# Patient Record
Sex: Male | Born: 1940 | Race: White | Hispanic: No | Smoking: Former smoker
Health system: Southern US, Community
[De-identification: ages and names within clinical notes are randomized; demographics above are authoritative.]

## PROBLEM LIST (undated history)

## (undated) DIAGNOSIS — R55 Syncope and collapse: Secondary | ICD-10-CM

## (undated) DIAGNOSIS — F329 Major depressive disorder, single episode, unspecified: Secondary | ICD-10-CM

## (undated) DIAGNOSIS — G4730 Sleep apnea, unspecified: Secondary | ICD-10-CM

## (undated) DIAGNOSIS — F32A Depression, unspecified: Secondary | ICD-10-CM

## (undated) DIAGNOSIS — E785 Hyperlipidemia, unspecified: Secondary | ICD-10-CM

## (undated) DIAGNOSIS — M1712 Unilateral primary osteoarthritis, left knee: Secondary | ICD-10-CM

## (undated) DIAGNOSIS — M1711 Unilateral primary osteoarthritis, right knee: Secondary | ICD-10-CM

## (undated) DIAGNOSIS — IMO0001 Reserved for inherently not codable concepts without codable children: Secondary | ICD-10-CM

## (undated) DIAGNOSIS — I10 Essential (primary) hypertension: Secondary | ICD-10-CM

## (undated) HISTORY — PX: KNEE SURGERY: SHX244

## (undated) HISTORY — PX: ROTATOR CUFF REPAIR: SHX139

## (undated) HISTORY — PX: JOINT REPLACEMENT: SHX530

## (undated) HISTORY — PX: EYE SURGERY: SHX253

---
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
## 2004-10-28 ENCOUNTER — Ambulatory Visit: Payer: Self-pay | Admitting: Internal Medicine

## 2004-10-28 ENCOUNTER — Ambulatory Visit (HOSPITAL_BASED_OUTPATIENT_CLINIC_OR_DEPARTMENT_OTHER): Admission: RE | Admit: 2004-10-28 | Discharge: 2004-10-28 | Payer: Self-pay | Admitting: Family Medicine

## 2004-11-11 ENCOUNTER — Ambulatory Visit (HOSPITAL_BASED_OUTPATIENT_CLINIC_OR_DEPARTMENT_OTHER): Admission: RE | Admit: 2004-11-11 | Discharge: 2004-11-11 | Payer: Self-pay | Admitting: Family Medicine

## 2007-08-30 ENCOUNTER — Ambulatory Visit: Payer: Self-pay

## 2008-04-23 ENCOUNTER — Ambulatory Visit (HOSPITAL_BASED_OUTPATIENT_CLINIC_OR_DEPARTMENT_OTHER): Admission: RE | Admit: 2008-04-23 | Discharge: 2008-04-23 | Payer: Self-pay | Admitting: Orthopedic Surgery

## 2008-04-30 ENCOUNTER — Encounter: Admission: RE | Admit: 2008-04-30 | Discharge: 2008-04-30 | Payer: Self-pay | Admitting: Orthopedic Surgery

## 2009-01-02 ENCOUNTER — Encounter: Admission: RE | Admit: 2009-01-02 | Discharge: 2009-01-02 | Payer: Self-pay | Admitting: Podiatry

## 2009-04-02 ENCOUNTER — Encounter: Admission: RE | Admit: 2009-04-02 | Discharge: 2009-04-02 | Payer: Self-pay | Admitting: Family Medicine

## 2011-03-03 NOTE — Op Note (Signed)
NAMEJAIYON, Joe [name]                   ACCOUNT NO.:  [id number]   MEDICAL RECORD NO.:  [id number]          PATIENT TYPE:  AMB   LOCATION:  DSC                          FACILITY:  [facility]   PHYSICIAN:  Robert A. Thurston Hole, M.D. DATE OF BIRTH:  13-Aug-1941   DATE OF PROCEDURE:  04/23/2008  DATE OF DISCHARGE:                               OPERATIVE REPORT   PREOPERATIVE DIAGNOSES:  Left knee medial and lateral meniscal tears  with chondromalacia and synovitis.   POSTOPERATIVE DIAGNOSES:  Left knee medial and lateral meniscal tears  with chondromalacia and synovitis.   PROCEDURE:  1. Left knee EUA, followed by arthroscopic partial, medial, and      lateral meniscectomies.  2. Left knee chondroplasty with partial synovectomy.   SURGEON:  Nilda Simmer, MD   ANESTHESIA:  General.   OPERATIVE TIME:  Operative time of 40 minutes.   COMPLICATIONS:  None.   INDICATIONS FOR PROCEDURE:  Joe [name] is a 70 year old gentleman who has  had significant left knee pain for the past 8-10 months, increasing in  nature with the examined MRI documenting meniscal tear with  chondromalacia and synovitis.  He has failed conservative care and is  now to undergo arthroscopy.   DESCRIPTION:  Joe [name] is brought in the operating room on April 23, 2008,  after a knee block was placed in the holding area by anesthesia.  He was  placed in the operative table in the supine position.  After being  placed under general anesthesia, his left knee was examined.  He had  full range of motion, 1-2+ crepitation in knee, stable ligamentous exam  with normal patellar tracking.  Left leg was prepped using sterile  DuraPrep and draped using a sterile technique.   Originally, through an anterolateral portal, the arthroscope with a pump  attachment was placed into anteromedial portal and arthroscopic probe  was placed.  On initial inspection of the medial compartment, the  articular cartilage had a 50-60% grade III  chondromalacia, which was  debrided.  Medial meniscus tear to the posterior medial horn of which 40-  50% was resected back to stable rim.  The intercondylar notch was  inspected.  Anterior and posterior cruciate ligaments were normal.  Lateral compartment inspected.  Articular cartilage showed 25-30% grade  III chondromalacia, which was debrided.   Lateral meniscus showed a tear of 20% to the posterolateral corner,  which was resected back to a stable rim.  Patellofemoral joint showed  50% grade III chondromalacia on the patellofemoral groove, and this was  debrided.  The patella tracked normally.  Moderate synovitis in the  medial and lateral gutters were debrided, and otherwise they are free of  pathology.  At this time, it was felt  that all the pathology have been  satisfactorily addressed.  The instruments were removed.  Portals were  closed with 3-0 nylon suture and injected with 0.25% Marcaine with  epinephrine and 4 mg of morphine.  Sterile dressing were placed.  The  patient awakened and taken to recovery room in stable condition.   FOLLOWUP CARE:  Joe [name] will be followed as an outpatient and on  Vicodin for pain.  He will be seen back in the office in a week for  sutures out and followup.      Robert A. Thurston Hole, M.D.  Electronically Signed     RAW/MEDQ  D:  04/23/2008  T:  04/24/2008  Job:  [id number]

## 2011-03-06 NOTE — Procedures (Signed)
NAME:  Joe [name], Joe [name]                   ACCOUNT NO.:  [id number]   MEDICAL RECORD NO.:  [id number]          PATIENT TYPE:  OUT   LOCATION:  SLEEP CENTER                 FACILITY:  [facility]   PHYSICIAN:  Clinton D. Maple Hudson, M.D. DATE OF BIRTH:  02/23/1941   DATE OF STUDY:  11/11/2004                              NOCTURNAL POLYSOMNOGRAM   STUDY DATE:  11/11/04   REFERRING PHYSICIAN:  Donia Guiles, M.D.   INDICATION FOR STUDY:  Hypersomnia with sleep apnea.   EPWORTH SLEEPINESS SCORE:  16/24.   BMI:  37.8.   WEIGHT:  250 pounds.   Diagnostic NPSG on 10/28/04 recorded RDI of 113.9 per hour.  CPAP titration  is requested.   SLEEP ARCHITECTURE:  Total sleep time 386 minutes with sleep efficiency 83%.  Stage I was 4%, stage II 73%, stages III and IV 5%.  REM was 18% of total  sleep time.  Sleep latency 50 minutes.  REM latency 224 minutes.  Awake  after sleep onset 31 minutes.  Arousal index 22.   RESPIRATORY DATA:  CPAP titration protocol.  CPAP was titrated to 16 CWP,  RDI of 14 per hour.  At this level, snoring was eliminated.  There were  residual apneas.  Because of persistent snoring, the technician advanced  CPAP above 12 CWP at which level RDI was 0.  There may have been some  decline in effect with increasing pressure, related to increasing nasal  congestion.  A medium comfort gel mask was used with heated humidifier.   OXYGEN DATA:  Very loud snoring before CPAP control until final elimination  at 16 CWP.  Oxygen saturation at final CPAP pressures was held at 95% to 96%  on room air.   CARDIAC DATA:  Normal sinus rhythm.   MOVEMENTS/PARASOMNIA:  Frequent leg jerks with a total of 704 recorded of  which 45 were associated with arousal or awakening for periodic limb  movement with arousal index of 7 per hour which is increased.   IMPRESSION/RECOMMENDATION:  1.  CPAP titration to 16 CWP, RDI 14 per hour using a medium comfort gel      nasal mask with heated humidifier.  This  pressure was required for      snoring control.  Suggest adjustment downward towards 12 CWP if      necessary for comfort.  2.  Periodic limb movement with arousal, 7 per hour.  This may clear with      treatment for sleep apnea.  Otherwise consider trial of Clonazepam or      Requip if clinically appropriate.      CDY/MEDQ  D:  11/16/2004 10:49:14  T:  11/16/2004 16:02:42  Job:  [id number]

## 2011-03-06 NOTE — Procedures (Signed)
NAME:  [name], Joe                   ACCOUNT NO.:  [id number]   MEDICAL RECORD NO.:  [id number]          PATIENT TYPE:  OUT   LOCATION:  SLEEP CENTER                 [facility]ACILITY:  [facility]   PHYSICIAN:  Clinton D. Maple Hudson, M.D. DATE OF BIRTH:  02-02-1941   DATE OF STUDY:  10/28/2004                              NOCTURNAL POLYSOMNOGRAM   INDICATIONS FOR STUDY:  Hypersomnia with sleep apnea. BMI 37.8, weight 250  pounds.   SLEEP ARCHITECTURE:  Total sleep time 247 minutes with sleep efficiency  reduced at 42%. Stage I was 4%, stage II was 47%, stages III and IV were  absent, REM was 7% of total sleep time. Latency to sleep onset 129 minutes.  Latency to REM 229 minutes. Awake after sleep onset 59 minutes. Arousal  index elevated 128.   RESPIRATORY DATA:  RDI 113.9 obstructive events per hour indicating very  severe obstructive sleep apnea/hypopnea syndrome. This included 224  obstructive apneas, 245 hypopneas. Events were not positional. REM RDI 89  per hour. There was insufficient time for the technician to use the CPAP  titration protocol because sleep onset was not sustained until nearly 1 a.m.   OXYGEN DATA:  Moderate to loud snoring with oxygen desaturation to a nadir  of 75%.  Mean oxygen saturation through the study was 90% to 92% on room  air.   CARDIAC DATA:  Sinus rhythm with occasional blocked PAC and PVC.   MOVEMENT/PARASOMNIA:  A total of 78 limb jerks were recorded of which 38  were associated with arousal or awakening for periodic limb movement with  arousal index of 9.2 per hour which is increased.   IMPRESSION/RECOMMENDATIONS:  Very severe obstructive sleep apnea/hypopnea  syndrome, RDI 113.9 per hour with oxygen desaturation to 75%.  Consider  early return for CPAP titration. Periodic limb movement with arousal, 9.2  per hour, can be reconsidered as appropriate after sleep is consolidated  with CPAP.                                                           Clinton D.  Maple Hudson, M.D.  Diplomate, [employer]   CDY/MEDQ  D:  11/02/2004 09:32:59  T:  11/02/2004 10:40:27  Job:  [id number]

## 2011-07-08 ENCOUNTER — Encounter (HOSPITAL_BASED_OUTPATIENT_CLINIC_OR_DEPARTMENT_OTHER)
Admission: RE | Admit: 2011-07-08 | Discharge: 2011-07-08 | Disposition: A | Discharge status: Home / Self Care | Payer: Medicare Other | Source: Ambulatory Visit | Attending: Orthopedic Surgery | Admitting: Orthopedic Surgery

## 2011-07-08 LAB — BASIC METABOLIC PANEL
Chloride: 101 mEq/L (ref 96–112)
GFR calc Af Amer: >60 mL/min (ref >60)
GFR calc non Af Amer: >60 mL/min (ref >60)
Glucose, Bld: 108 mg/dL — ABNORMAL HIGH (ref 70–99)
Potassium: 3.7 mEq/L (ref 3.5–5.1)
Sodium: 138 mEq/L (ref 135–145)

## 2011-07-13 ENCOUNTER — Ambulatory Visit (HOSPITAL_BASED_OUTPATIENT_CLINIC_OR_DEPARTMENT_OTHER)
Admission: RE | Admit: 2011-07-13 | Discharge: 2011-07-14 | Disposition: A | Discharge status: Home / Self Care | Payer: Medicare Other | Source: Ambulatory Visit | Attending: Orthopedic Surgery | Admitting: Orthopedic Surgery

## 2011-07-13 DIAGNOSIS — M9420 Chondromalacia, unspecified site: Secondary | ICD-10-CM | POA: Insufficient documentation

## 2011-07-13 DIAGNOSIS — Z01810 Encounter for preprocedural cardiovascular examination: Secondary | ICD-10-CM | POA: Insufficient documentation

## 2011-07-13 DIAGNOSIS — M67919 Unspecified disorder of synovium and tendon, unspecified shoulder: Secondary | ICD-10-CM | POA: Insufficient documentation

## 2011-07-13 DIAGNOSIS — Z01812 Encounter for preprocedural laboratory examination: Secondary | ICD-10-CM | POA: Insufficient documentation

## 2011-07-13 DIAGNOSIS — G4733 Obstructive sleep apnea (adult) (pediatric): Secondary | ICD-10-CM | POA: Insufficient documentation

## 2011-07-13 DIAGNOSIS — I10 Essential (primary) hypertension: Secondary | ICD-10-CM | POA: Insufficient documentation

## 2011-07-13 DIAGNOSIS — M719 Bursopathy, unspecified: Secondary | ICD-10-CM | POA: Insufficient documentation

## 2011-07-13 DIAGNOSIS — M25819 Other specified joint disorders, unspecified shoulder: Secondary | ICD-10-CM | POA: Insufficient documentation

## 2011-07-13 DIAGNOSIS — E669 Obesity, unspecified: Secondary | ICD-10-CM | POA: Insufficient documentation

## 2011-07-13 DIAGNOSIS — M19019 Primary osteoarthritis, unspecified shoulder: Secondary | ICD-10-CM | POA: Insufficient documentation

## 2011-07-13 LAB — POCT HEMOGLOBIN-HEMACUE: Hemoglobin: 13.3 g/dL (ref 13.0–17.0)

## 2011-07-14 NOTE — Op Note (Signed)
NAMEJAVONTE, ELENES                   ACCOUNT NO.:  [id number]  MEDICAL RECORD NO.:  [id number]  LOCATION:                                 FACILITY:  PHYSICIAN:  Elana Alm. Thurston Hole, M.D.      DATE OF BIRTH:  DATE OF PROCEDURE:  07/13/2011 DATE OF DISCHARGE:                              OPERATIVE REPORT   PREOPERATIVE DIAGNOSES: 1. Right shoulder partial rotator cuff tear with partial labrum tear. 2. Right shoulder biceps tendon rupture. 3. Right shoulder chondromalacia. 4. Right shoulder impingement. 5. Right shoulder acromioclavicular joint degenerative joint disease     and spurring.  POSTOPERATIVE DIAGNOSES: 1. Right shoulder partial rotator cuff tear with partial labrum tear. 2. Right shoulder biceps tendon rupture. 3. Right shoulder chondromalacia. 4. Right shoulder impingement. 5. Right shoulder acromioclavicular joint degenerative joint disease     and spurring.  PROCEDURE: 1. Right shoulder examination under anesthesia followed by    arthroscopic debridement, partial rotator cuff and partial labrum     tear. 2. Right shoulder subacromial decompression. 3. Right shoulder distal clavicle excision.  SURGEON:  Elana Alm. Thurston Hole, M.D.  ASSISTANT:  Julien Girt, PA-C  ANESTHESIA:  General.  OPERATIVE TIME:  45 minutes.  COMPLICATIONS:  None.  INDICATIONS FOR PROCEDURE:  Mr. Woessner is a 70 year old gentleman who has had significant right shoulder pain for the past 4-5 months, increasing in nature with exam and MRI documenting partial rotator cuff and partial labrum tear with biceps tendon rupture with impingement and AC joint arthropathy.  He has failed conservative care and is now to undergo arthroscopy.  DESCRIPTION:  Mr. Liska was brought to the operating room on July 13, 2011, after interscalene block was placed in the holding room by Anesthesia.  He was placed on operative table in supine position.  He received vancomycin 1 gram IV preoperatively for  prophylaxis.  After being placed under general anesthesia, his right shoulder was examined. He had full range of motion.  Shoulder was stable to ligamentous exam. He was then placed in beach-chair position and shoulder arm was prepped using sterile DuraPrep and draped using sterile technique.  Time-out procedure was called and the correct right shoulder identified. Initially through a posterior arthroscopic portal, the arthroscope with a pump attached was placed into an anterior portal and arthroscopic probe was placed.  On initial inspection, the articular cartilage in the glenohumeral joint showed 30% grade 3 chondromalacia on the glenoid humeral and this was debrided.  He had partial tearing of the anterior, superior, posterior, and inferior labrum 25%, which was debrided.  The anterior-inferior glenohumeral ligament complex was intact.  The biceps tendon was nonexistent in the joint, but the stump area was stable and the stump was debrided.  Rotator cuff showed partial tearing of the subscapularis 25%, which was debrided.  The rest of the rotator cuff was intact.  Inferior capsular recess was free of pathology.  Subacromial space was entered and a lateral arthroscopic portal was made. Moderately thickened bursitis was resected.  The rotator cuff was somewhat frayed on the bursal surface, but no evidence of a tear. Impingement was noted and a subacromial decompression was carried out  removing 6-8 mm of the undersurface of the anterior, anterolateral, anteromedial acromion and CA ligament release carried out as well.  The [facility] joint showed significant spurring and degenerative changes, and distal 8-10 mm of clavicle was resected with a 6-mm bur.  After this was done, the shoulder could be brought through full range of motion with no impingement on the rotator cuff.  At this point, it was felt that all pathology had been satisfactorily addressed.  The instruments were removed.  Portals  were closed with 3-0 nylon suture.  Sterile dressings and a sling applied, and the patient awakened and taken to recovery room in stable condition.  FOLLOWUP CARE:  Mr. Mancinas will be followed as an outpatient on Percocet for pain.  He will be seen back in the office in a week for sutures out and followup.     Rohan Juenger A. Thurston Hole, M.D.     RAW/MEDQ  D:  07/13/2011  T:  07/13/2011  Job:  [id number]  Electronically Signed by Salvatore Marvel M.D. on 07/14/2011 07:48:20 AM

## 2011-07-16 LAB — BASIC METABOLIC PANEL
Calcium: 8.7
Chloride: 104
Creatinine, Ser: 0.87
GFR calc Af Amer: >60
Sodium: 139

## 2011-07-16 LAB — POCT HEMOGLOBIN-HEMACUE: Hemoglobin: 14

## 2011-10-30 ENCOUNTER — Emergency Department: Payer: Self-pay | Admitting: Emergency Medicine

## 2012-12-11 ENCOUNTER — Emergency Department (HOSPITAL_COMMUNITY): Payer: Medicare Other

## 2012-12-11 ENCOUNTER — Observation Stay (HOSPITAL_COMMUNITY)
Admission: EM | Admit: 2012-12-11 | Discharge: 2012-12-14 | Disposition: A | Discharge status: Home / Self Care | Payer: Medicare Other | Attending: Internal Medicine | Admitting: Internal Medicine

## 2012-12-11 ENCOUNTER — Encounter (HOSPITAL_COMMUNITY): Payer: Self-pay | Admitting: Emergency Medicine

## 2012-12-11 DIAGNOSIS — R55 Syncope and collapse: Secondary | ICD-10-CM | POA: Diagnosis present

## 2012-12-11 DIAGNOSIS — E785 Hyperlipidemia, unspecified: Secondary | ICD-10-CM | POA: Diagnosis present

## 2012-12-11 DIAGNOSIS — E876 Hypokalemia: Secondary | ICD-10-CM | POA: Diagnosis present

## 2012-12-11 DIAGNOSIS — S0080XA Unspecified superficial injury of other part of head, initial encounter: Secondary | ICD-10-CM | POA: Insufficient documentation

## 2012-12-11 DIAGNOSIS — F3289 Other specified depressive episodes: Secondary | ICD-10-CM | POA: Insufficient documentation

## 2012-12-11 DIAGNOSIS — F32A Depression, unspecified: Secondary | ICD-10-CM | POA: Diagnosis present

## 2012-12-11 DIAGNOSIS — I10 Essential (primary) hypertension: Secondary | ICD-10-CM | POA: Diagnosis present

## 2012-12-11 DIAGNOSIS — H8110 Benign paroxysmal vertigo, unspecified ear: Principal | ICD-10-CM | POA: Diagnosis present

## 2012-12-11 DIAGNOSIS — F329 Major depressive disorder, single episode, unspecified: Secondary | ICD-10-CM | POA: Insufficient documentation

## 2012-12-11 DIAGNOSIS — W19XXXA Unspecified fall, initial encounter: Secondary | ICD-10-CM | POA: Insufficient documentation

## 2012-12-11 DIAGNOSIS — Y9239 Other specified sports and athletic area as the place of occurrence of the external cause: Secondary | ICD-10-CM | POA: Insufficient documentation

## 2012-12-11 DIAGNOSIS — S0000XA Unspecified superficial injury of scalp, initial encounter: Secondary | ICD-10-CM | POA: Insufficient documentation

## 2012-12-11 HISTORY — DX: Syncope and collapse: R55

## 2012-12-11 HISTORY — DX: Major depressive disorder, single episode, unspecified: F32.9

## 2012-12-11 HISTORY — DX: Essential (primary) hypertension: I10

## 2012-12-11 HISTORY — DX: Hyperlipidemia, unspecified: E78.5

## 2012-12-11 HISTORY — DX: Sleep apnea, unspecified: G47.30

## 2012-12-11 HISTORY — DX: Depression, unspecified: F32.A

## 2012-12-11 LAB — BASIC METABOLIC PANEL
CO2: 23 mEq/L (ref 19–32)
Chloride: 102 mEq/L (ref 96–112)
Potassium: 3.1 mEq/L — ABNORMAL LOW (ref 3.5–5.1)
Sodium: 138 mEq/L (ref 135–145)

## 2012-12-11 LAB — CBC WITH DIFFERENTIAL/PLATELET
Basophils Absolute: 0 K/uL (ref 0.0–0.1)
Basophils Relative: 0 % (ref 0–1)
Eosinophils Absolute: 0.3 K/uL (ref 0.0–0.7)
Eosinophils Relative: 5 % (ref 0–5)
HCT: 40.9 % (ref 39.0–52.0)
Hemoglobin: 13.5 g/dL (ref 13.0–17.0)
Lymphocytes Relative: 25 % (ref 12–46)
Lymphs Abs: 1.8 K/uL (ref 0.7–4.0)
MCH: 26.2 pg (ref 26.0–34.0)
MCHC: 33 g/dL (ref 30.0–36.0)
MCV: 79.4 fL (ref 78.0–100.0)
Monocytes Absolute: 0.6 K/uL (ref 0.1–1.0)
Monocytes Relative: 8 % (ref 3–12)
Neutro Abs: 4.6 K/uL (ref 1.7–7.7)
Neutrophils Relative %: 62 % (ref 43–77)
Platelets: 168 K/uL (ref 150–400)
RBC: 5.15 MIL/uL (ref 4.22–5.81)
RDW: 14 % (ref 11.5–15.5)
WBC: 7.4 K/uL (ref 4.0–10.5)

## 2012-12-11 LAB — TROPONIN I
Troponin I: <0.3 ng/mL (ref <0.30)
Troponin I: <0.3 ng/mL (ref <0.30)

## 2012-12-11 MED ORDER — LISINOPRIL-HYDROCHLOROTHIAZIDE 20-25 MG PO TABS: 1.0000 | ORAL_TABLET | Freq: Every day | ORAL | Status: DC

## 2012-12-11 MED ORDER — CITALOPRAM HYDROBROMIDE 20 MG PO TABS
20.0000 mg | ORAL_TABLET | Freq: Every day | ORAL | Status: DC
  Administered 2012-12-11 – 2012-12-13 (×3): 20 mg via ORAL
  Filled 2012-12-11 (×4): qty 1

## 2012-12-11 MED ORDER — HYDROCHLOROTHIAZIDE 25 MG PO TABS
25.0000 mg | ORAL_TABLET | Freq: Every day | ORAL | Status: DC
  Administered 2012-12-11: 25 mg via ORAL
  Filled 2012-12-11 (×2): qty 1

## 2012-12-11 MED ORDER — LISINOPRIL 20 MG PO TABS
20.0000 mg | ORAL_TABLET | Freq: Every day | ORAL | Status: DC
  Administered 2012-12-11 – 2012-12-13 (×3): 20 mg via ORAL
  Filled 2012-12-11 (×4): qty 1

## 2012-12-11 MED ORDER — SODIUM CHLORIDE 0.9 % IV SOLN
INTRAVENOUS | Status: AC
  Administered 2012-12-11 (×2): via INTRAVENOUS

## 2012-12-11 MED ORDER — POTASSIUM CHLORIDE CRYS ER 20 MEQ PO TBCR
40.0000 meq | EXTENDED_RELEASE_TABLET | ORAL | Status: AC
  Administered 2012-12-11 (×2): 40 meq via ORAL
  Filled 2012-12-11 (×2): qty 2

## 2012-12-11 MED ORDER — TAMSULOSIN HCL 0.4 MG PO CAPS
0.4000 mg | ORAL_CAPSULE | Freq: Every day | ORAL | Status: DC
  Administered 2012-12-11 – 2012-12-13 (×3): 0.4 mg via ORAL
  Filled 2012-12-11 (×4): qty 1

## 2012-12-11 MED ORDER — ONDANSETRON HCL 4 MG PO TABS: 4.0000 mg | ORAL_TABLET | Freq: Four times a day (QID) | ORAL | Status: DC | PRN

## 2012-12-11 MED ORDER — ASPIRIN EC 81 MG PO TBEC
81.0000 mg | DELAYED_RELEASE_TABLET | Freq: Every day | ORAL | Status: DC
  Administered 2012-12-11 – 2012-12-13 (×3): 81 mg via ORAL
  Filled 2012-12-11 (×4): qty 1

## 2012-12-11 MED ORDER — ONDANSETRON HCL 4 MG/2ML IJ SOLN: 4.0000 mg | Freq: Four times a day (QID) | INTRAMUSCULAR | Status: DC | PRN

## 2012-12-11 MED ORDER — SODIUM CHLORIDE 0.9 % IV BOLUS (SEPSIS)
500.0000 mL | Freq: Once | INTRAVENOUS | Status: AC
  Administered 2012-12-11: 500 mL via INTRAVENOUS

## 2012-12-11 MED ORDER — SODIUM CHLORIDE 0.9 % IJ SOLN: 3.0000 mL | Freq: Two times a day (BID) | INTRAMUSCULAR | Status: DC

## 2012-12-11 MED ORDER — SODIUM CHLORIDE 0.9 % IJ SOLN: 3.0000 mL | INTRAMUSCULAR | Status: DC | PRN

## 2012-12-11 MED ORDER — SODIUM CHLORIDE 0.9 % IV SOLN: 250.0000 mL | INTRAVENOUS | Status: DC | PRN

## 2012-12-11 MED ORDER — ENOXAPARIN SODIUM 40 MG/0.4ML ~~LOC~~ SOLN
40.0000 mg | SUBCUTANEOUS | Status: DC
  Administered 2012-12-11 – 2012-12-13 (×3): 40 mg via SUBCUTANEOUS
  Filled 2012-12-11 (×4): qty 0.4

## 2012-12-11 MED ORDER — SODIUM CHLORIDE 0.9 % IJ SOLN
3.0000 mL | Freq: Two times a day (BID) | INTRAMUSCULAR | Status: DC
  Administered 2012-12-13 – 2012-12-14 (×3): 3 mL via INTRAVENOUS

## 2012-12-11 MED ORDER — HYDROCODONE-ACETAMINOPHEN 5-325 MG PO TABS
1.0000 | ORAL_TABLET | ORAL | Status: DC | PRN
  Administered 2012-12-11: 2 via ORAL
  Filled 2012-12-11: qty 2

## 2012-12-11 NOTE — Progress Notes (Signed)
Chaplain responded to ED trauma page for 72 year old man who fell and injured his head while working out at a gym. I accompanied pt's wife to consult B where we visited and prayed for pt. Chaplain provided a calming presence for pt's wife. Two friends joined her and I accompanied pt's wife and friends to Trauma B to visit pt. Pt was stable and communicating freely. Wife is relieved. Pt will be admitted to a room upstairs.

## 2012-12-11 NOTE — ED Notes (Signed)
Per EMS:  [place] working out at the [employer], witnessed fall backwards after getting off treadmill.  GCS initially 3.  Combative on EMS arrival.  Head laceration noted to back of head, (R) elbow abrasion, bleeding controlled.  Pt has an artifical (L) eye.  Pt regained consciousness en route, 100 sinus tach (intially 190s afib RVR).  20ga Lhand.  CBG 110.

## 2012-12-11 NOTE — Progress Notes (Signed)
Progress note:  Patient sat down by the side of the bed to have dinner and suddenly felt very dizzy and nauseous. The dizziness and nausea resolved on ts own in about 5 minutes. No rhythm abnormalities noted and patient BP was also stable(elevated). Given his symptoms of dizziness: -give 500 cc NS bolus -Give 125cc/hr for 12 hours -ordered CPAP per home regimen  Lars Mage MD Pager [phone]

## 2012-12-11 NOTE — H&P (Signed)
History and Physical  Joe [name] QMV:[id number] DOB: 11/01/1940 DOA: 12/11/2012  Referring physician: Dr. Blinda Leatherwood PCP: No primary provider on file.   Chief Complaint: Syncope  HPI: Patient is a 72 year Old man with past medical history most significant for family history of premature coronary artery disease, past smoker who quit in 1999, hypertension, hyperlipidemia and depression who was brought to the ER for evaluation after a fall. Patient recently joined a local gym and today was his third gym session. Patient was working out at [employer], walking on the treadmill for about 10 minutes prior to the episode. He got off of the treadmill and fell backwards, hitting his head on the ground. There was loss of consciousness. Patient does not remember the episode. EMS responded to the scene and found him still unresponsive. They reported that he was unresponsive for a period of approximately 5 minutes and then became agitated and combative. Patient was initially tachycardic with heart rate as high as 190 as per EMS, slowly became normal sinus rhythm. EMS reports that the patient has slowly become more lucid, now alert and oriented. At arrival to the ER, patient has no complaints. He denies chest pain, shortness of breath, palpitations, abdominal pain. No recent illness.   Review of Systems:  Review of Systems  Respiratory: Negative.  Cardiovascular: Negative.  Skin: Positive for wound on the back of head  Neurological: Positive for syncope. Negative for seizures.  All other systems reviewed and are negative.   Past Medical History  Diagnosis Date  . Hypertension     Past Surgical History  Procedure Laterality Date  . Eye surgery Left prosthesis  . Knee surgery    . Rotator cuff repair      Social History:  reports that he has 50-pack-year smoking history but quit in 1999. He does not have any smokeless tobacco history on file. He reports that  drinks alcohol. He reports that he does not use  illicit drugs.  Allergies  Allergen Reactions  . Demerol (Meperidine) Nausea And Vomiting and Rash  . Penicillins Rash    Family history Patient's father had his first heart attack at age 38 and eventually died of a heart attack at age 76. His brother died of a heart attack at age 62.   Prior to Admission medications   Medication Sig Start Date End Date Taking? Authorizing Provider  aspirin EC 81 MG tablet Take 81 mg by mouth daily.   Yes Historical Provider, MD  citalopram (CELEXA) 20 MG tablet Take 20 mg by mouth daily.   Yes Historical Provider, MD  lisinopril-hydrochlorothiazide (PRINZIDE,ZESTORETIC) 20-25 MG per tablet Take 1 tablet by mouth daily.   Yes Historical Provider, MD  Omega-3 Fatty Acids (FISH OIL) 1000 MG CAPS Take 1 capsule by mouth daily.   Yes Historical Provider, MD  PRESCRIPTION MEDICATION Take 1 tablet by mouth daily. Unknown med   Yes Historical Provider, MD  Tamsulosin HCl (FLOMAX) 0.4 MG CAPS Take 0.4 mg by mouth daily.   Yes Historical Provider, MD   Physical Exam: Filed Vitals:   12/11/12 1300 12/11/12 1315 12/11/12 1330 12/11/12 1345  BP: 141/65 144/61 147/67 141/64  Pulse: 63 62 58 57  Temp:      TempSrc:      Resp: 11 19 20 18   SpO2: 98% 96% 95% 95%   Physical Exam: General: Vital signs reviewed and noted. Well-developed, well-nourished, in no acute distress; alert, appropriate and cooperative throughout examination.  Head:  patient has  an anterior cruciate ligament of bruise which is about 5 and 5 cm at the back of his head, no open lesions noted.  Eyes: PERRL, EOMI, No signs of anemia or jaundince.  Nose: Mucous membranes moist, not inflammed, nonerythematous.  Throat: Oropharynx nonerythematous, no exudate appreciated.   Neck: No deformities, masses, or tenderness noted.Supple, No carotid Bruits, no JVD.  Lungs:  Normal respiratory effort. Clear to auscultation BL without crackles or wheezes.  Heart: RRR. S1 and S2 normal without gallop or  rubs.patient has 3/6 early systolic murmur best heard at left lower sternal border.  Abdomen:  BS normoactive. Soft, Nondistended, non-tender.  No masses or organomegaly.  Extremities: No pretibial edema.  Neurologic: A&O X3, CN II - XII are grossly intact. Motor strength is 5/5 in the all 4 extremities, Sensations intact to light touch, Cerebellar signs negative.  Skin: No visible rashes, scars.     Wt Readings from Last 3 Encounters:  No data found for Wt    Labs on Admission:  Basic Metabolic Panel:  Recent Labs Lab 12/11/12 0918  NA 138  K 3.1*  CL 102  CO2 23  GLUCOSE 141*  BUN 22  CREATININE 0.90  CALCIUM 9.2    CBC:  Recent Labs Lab 12/11/12 0918  WBC 7.4  NEUTROABS 4.6  HGB 13.5  HCT 40.9  MCV 79.4  PLT 168    Cardiac Enzymes:  Recent Labs Lab 12/11/12 0919 12/11/12 1520  TROPONINI <0.30 <0.30    Radiological Exams on Admission: Ct Head Wo Contrast  12/11/2012  *RADIOLOGY REPORT*  Clinical Data:   Fall while exercising.  CT HEAD WITHOUT CONTRAST  Technique: Contiguous axial images were obtained from the base of the skull through the vertex without contrast  Comparison: Brain MR of 04/02/2009.  No prior cervical spine imaging.  Findings:  Bone windows demonstrate prosthetic left globe.  Scalp soft tissue swelling is mild to moderate posteriorly, near the vertex.  Example image 63/series 4.  Fluid within the left maxillary sinus.  Mucosal thickening of the right maxillary sinus.  Hypoplastic frontal sinuses. No skull fracture.  Soft tissue windows demonstrate no  mass lesion, hemorrhage, hydrocephalus, acute infarct, intra-axial, or extra-axial fluid collection.  IMPRESSION:  1.  Posterior scalp soft tissue swelling, without acute intracranial abnormality. 2.  Sinus disease.  CT CERVICAL SPINE WITHOUT CONTRAST  Technique: Continous axial images were obtained of the cervical spine without contrast.  Sagittal and coronal reformats were constructed.  Findings:   Spinal visualization through the bottom of C7. Prevertebral soft tissues are within normal limits.  Dental hardware artifact at the C2 level.  Spondylosis.  This results in mild right-sided neural foraminal narrowing at C4-C5 and bilateral neural foraminal narrowing at C6- C7.  Skull base intact.  Straightening of expected cervical lordosis. Maintenance of vertebral body height. Facets are well-aligned.  Coronal reformats demonstrate a normal C1-C2 articulation.  .  IMPRESSION: 1.  Spondylosis, without acute fracture or subluxation. 2. Straightening of expected cervical lordosis could be positional, due to muscular spasm, or ligamentous injury.   Original Report Authenticated By: Jeronimo Greaves, M.D.    Ct Cervical Spine Wo Contrast  12/11/2012  *RADIOLOGY REPORT*  Clinical Data:   Fall while exercising.  CT HEAD WITHOUT CONTRAST  Technique: Contiguous axial images were obtained from the base of the skull through the vertex without contrast  Comparison: Brain MR of 04/02/2009.  No prior cervical spine imaging.  Findings:  Bone windows demonstrate prosthetic left globe.  Scalp  soft tissue swelling is mild to moderate posteriorly, near the vertex.  Example image 63/series 4.  Fluid within the left maxillary sinus.  Mucosal thickening of the right maxillary sinus.  Hypoplastic frontal sinuses. No skull fracture.  Soft tissue windows demonstrate no  mass lesion, hemorrhage, hydrocephalus, acute infarct, intra-axial, or extra-axial fluid collection.  IMPRESSION:  1.  Posterior scalp soft tissue swelling, without acute intracranial abnormality. 2.  Sinus disease.  CT CERVICAL SPINE WITHOUT CONTRAST  Technique: Continous axial images were obtained of the cervical spine without contrast.  Sagittal and coronal reformats were constructed.  Findings:  Spinal visualization through the bottom of C7. Prevertebral soft tissues are within normal limits.  Dental hardware artifact at the C2 level.  Spondylosis.  This results in mild  right-sided neural foraminal narrowing at C4-C5 and bilateral neural foraminal narrowing at C6- C7.  Skull base intact.  Straightening of expected cervical lordosis. Maintenance of vertebral body height. Facets are well-aligned.  Coronal reformats demonstrate a normal C1-C2 articulation.  .  IMPRESSION: 1.  Spondylosis, without acute fracture or subluxation. 2. Straightening of expected cervical lordosis could be positional, due to muscular spasm, or ligamentous injury.   Original Report Authenticated By: Jeronimo Greaves, M.D.     EKG: Independently reviewed.Rate: 96, Rhythm: normal sinus rhythm, QRS Axis: normal, Intervals: normal, ST/T Wave abnormalities: nonspecific ST/T changes, Conduction Disturbances:right bundle branch block, Narrative Interpretation:  Old EKG Reviewed: unchanged    Principal Problem:   Syncope and collapse Active Problems:   HTN (hypertension)   Hypokalemia   HLD (hyperlipidemia)   Depression   Assessment/Plan Patient is a healthy 72 year old man with PMH most significant for HTN and HLD who collapsed soon after finishing a run on treadmill in a local gym and lost consciousness for about 5 minutes as per EMS, now seems to be completely lucid. The most likely cause at this time seems to be cardiac arrhythmia which was likely exacerbated by acute volume loss/dehydration or electrolyte imbalance after a vigorous gym session. Other causes like structural heart disease and orthostasis, vasovagal are also likely but low on differential.  - admit to telemetry -cycle cardiac enzymes x 2 more -2D echo to rule out structural heart disease -monitor tele strips and morning 12 lead EKG for any rhythm abnormalities -replete K -continue home medications   Code Status: full Family Communication: updated at bedside Disposition Plan/Anticipated LOS: home when stable  Time spent: 70 minutes  Lars Mage, MD  [facility] Team 11 [facility]  If 7PM-7AM, please contact night-coverage  at [email or web address], password [facility] 12/11/2012, 5:04 PM

## 2012-12-11 NOTE — ED Provider Notes (Addendum)
History     CSN: [id number]  Arrival date & time 12/11/12  0912   First MD Initiated Contact with Patient 12/11/12 0920      Chief Complaint  Patient presents with  . Fall    (Consider location/radiation/quality/duration/timing/severity/associated sxs/prior treatment) HPI Comments: Patient brought to the ER for evaluation after a fall. Patient was working out at [employer], walking on the treadmill prior to the episode. He got off of the treadmill and fell backwards, hitting his head on the ground. There was loss of consciousness. Patient does not remember the episode. EMS responded to the scene and found him still unresponsive. They report that he was unresponsive for a period of approximately 5 minutes and then became agitated and combative. Patient was initially tachycardic, slowly became normal sinus rhythm. EMS reports that the patient has slowly become more lucid, now alert and oriented. At arrival to the ER, patient has no complaints. He denies chest pain, shortness of breath, palpitations, abdominal pain. No recent illness.  Patient is a 72 y.o. male presenting with fall.  Fall    No past medical history on file.  No past surgical history on file.  No family history on file.  History  Substance Use Topics  . Smoking status: Not on file  . Smokeless tobacco: Not on file  . Alcohol Use: Not on file      Review of Systems  Respiratory: Negative.   Cardiovascular: Negative.   Skin: Positive for wound.  Neurological: Positive for syncope. Negative for seizures.  All other systems reviewed and are negative.    Allergies  Review of patient's allergies indicates not on file.  Home Medications  No current outpatient prescriptions on file.  BP 159/49  Pulse 93  Temp(Src) 97.9 F (36.6 C) (Oral)  Resp 16  SpO2 99%  Physical Exam  Constitutional: He is oriented to person, place, and time. He appears well-developed and well-nourished. No distress.  HENT:  Head:  Normocephalic and atraumatic.    Right Ear: Hearing normal.  Nose: Nose normal.  Mouth/Throat: Oropharynx is clear and moist and mucous membranes are normal.  Eyes: Conjunctivae are normal.  Right pupil round and reactive to light  Left eye prosthesis    Neck: Normal range of motion. Neck supple. No spinous process tenderness and no muscular tenderness present.  Cardiovascular: Normal rate, regular rhythm, S1 normal and S2 normal.  Exam reveals no gallop and no friction rub.   No murmur heard. Pulmonary/Chest: Effort normal and breath sounds normal. No respiratory distress. He exhibits no tenderness.  Abdominal: Soft. Normal appearance and bowel sounds are normal. There is no hepatosplenomegaly. There is no tenderness. There is no rebound, no guarding, no tenderness at McBurney's point and negative Murphy's sign. No hernia.  Musculoskeletal: Normal range of motion.  Neurological: He is alert and oriented to person, place, and time. He has normal strength. No cranial nerve deficit or sensory deficit. Coordination normal. GCS eye subscore is 4. GCS verbal subscore is 5. GCS motor subscore is 6.  Skin: Skin is warm, dry and intact. No rash noted. No cyanosis.  Psychiatric: He has a normal mood and affect. His speech is normal and behavior is normal. Thought content normal.    ED Course  Procedures (including critical care time)  EKG:  Date: 12/11/2012  Rate: 96  Rhythm: normal sinus rhythm  QRS Axis: normal  Intervals: normal  ST/T Wave abnormalities: nonspecific ST/T changes  Conduction Disutrbances:right bundle branch block  Narrative Interpretation:  Old EKG Reviewed: unchanged    Labs Reviewed  CBC WITH DIFFERENTIAL  BASIC METABOLIC PANEL  TROPONIN I   Ct Head Wo Contrast  12/11/2012  *RADIOLOGY REPORT*  Clinical Data:   Fall while exercising.  CT HEAD WITHOUT CONTRAST  Technique: Contiguous axial images were obtained from the base of the skull through the vertex  without contrast  Comparison: Brain MR of 04/02/2009.  No prior cervical spine imaging.  Findings:  Bone windows demonstrate prosthetic left globe.  Scalp soft tissue swelling is mild to moderate posteriorly, near the vertex.  Example image 63/series 4.  Fluid within the left maxillary sinus.  Mucosal thickening of the right maxillary sinus.  Hypoplastic frontal sinuses. No skull fracture.  Soft tissue windows demonstrate no  mass lesion, hemorrhage, hydrocephalus, acute infarct, intra-axial, or extra-axial fluid collection.  IMPRESSION:  1.  Posterior scalp soft tissue swelling, without acute intracranial abnormality. 2.  Sinus disease.  CT CERVICAL SPINE WITHOUT CONTRAST  Technique: Continous axial images were obtained of the cervical spine without contrast.  Sagittal and coronal reformats were constructed.  Findings:  Spinal visualization through the bottom of C7. Prevertebral soft tissues are within normal limits.  Dental hardware artifact at the C2 level.  Spondylosis.  This results in mild right-sided neural foraminal narrowing at C4-C5 and bilateral neural foraminal narrowing at C6- C7.  Skull base intact.  Straightening of expected cervical lordosis. Maintenance of vertebral body height. Facets are well-aligned.  Coronal reformats demonstrate a normal C1-C2 articulation.  .  IMPRESSION: 1.  Spondylosis, without acute fracture or subluxation. 2. Straightening of expected cervical lordosis could be positional, due to muscular spasm, or ligamentous injury.   Original Report Authenticated By: Jeronimo Greaves, M.D.    Ct Cervical Spine Wo Contrast  12/11/2012  *RADIOLOGY REPORT*  Clinical Data:   Fall while exercising.  CT HEAD WITHOUT CONTRAST  Technique: Contiguous axial images were obtained from the base of the skull through the vertex without contrast  Comparison: Brain MR of 04/02/2009.  No prior cervical spine imaging.  Findings:  Bone windows demonstrate prosthetic left globe.  Scalp soft tissue swelling is  mild to moderate posteriorly, near the vertex.  Example image 63/series 4.  Fluid within the left maxillary sinus.  Mucosal thickening of the right maxillary sinus.  Hypoplastic frontal sinuses. No skull fracture.  Soft tissue windows demonstrate no  mass lesion, hemorrhage, hydrocephalus, acute infarct, intra-axial, or extra-axial fluid collection.  IMPRESSION:  1.  Posterior scalp soft tissue swelling, without acute intracranial abnormality. 2.  Sinus disease.  CT CERVICAL SPINE WITHOUT CONTRAST  Technique: Continous axial images were obtained of the cervical spine without contrast.  Sagittal and coronal reformats were constructed.  Findings:  Spinal visualization through the bottom of C7. Prevertebral soft tissues are within normal limits.  Dental hardware artifact at the C2 level.  Spondylosis.  This results in mild right-sided neural foraminal narrowing at C4-C5 and bilateral neural foraminal narrowing at C6- C7.  Skull base intact.  Straightening of expected cervical lordosis. Maintenance of vertebral body height. Facets are well-aligned.  Coronal reformats demonstrate a normal C1-C2 articulation.  .  IMPRESSION: 1.  Spondylosis, without acute fracture or subluxation. 2. Straightening of expected cervical lordosis could be positional, due to muscular spasm, or ligamentous injury.   Original Report Authenticated By: Jeronimo Greaves, M.D.      Diagnosis: 1. Syncope 2. Tachyarrhythmia 3. Minor head injury    MDM  Patient arrived via EMS after a syncopal episode with  head injury. Patient without complaints other than slight pain at the occipital region where he has a superficial wound. Patient was immobilized on spine board, but no cervical collar arrival. He was log rolled and his back was examined. There was no midline tenderness, step-off or defect. Likewise, patient did not have any midline tenderness in the cervical spine region. Because of the mechanism, however, patient placed in a cervical collar and  CT head and cervical spine obtained. CT head was unremarkable other than the posterior scalp injury. There was reversal of the cervical lordosis, likely secondary to the cervical collar. Patient did not have any pain or tenderness in the neck prior to the exam and was reexamined afterwards. Still no posterior midline tenderness.  Cause of the syncope is unclear at this time. EMS report that the patient's heart rate was 190 when they arrived, but that he then broke into a sinus tachycardia. Patient is not expressing any chest pain. Current EKG does not show any acute changes. His troponin was negative. Because of the possible tachyarrhythmia, patient will be observed in the hospital on telemetry.       Gilda Crease, MD 12/11/12 1150  Gilda Crease, MD 12/11/12 [phone]

## 2012-12-12 ENCOUNTER — Encounter (HOSPITAL_COMMUNITY): Payer: Self-pay | Admitting: General Practice

## 2012-12-12 DIAGNOSIS — I10 Essential (primary) hypertension: Secondary | ICD-10-CM

## 2012-12-12 DIAGNOSIS — E876 Hypokalemia: Secondary | ICD-10-CM

## 2012-12-12 DIAGNOSIS — R55 Syncope and collapse: Secondary | ICD-10-CM

## 2012-12-12 DIAGNOSIS — H8110 Benign paroxysmal vertigo, unspecified ear: Secondary | ICD-10-CM

## 2012-12-12 LAB — HEMOGLOBIN A1C: Mean Plasma Glucose: 120 mg/dL — ABNORMAL HIGH (ref <117)

## 2012-12-12 LAB — POTASSIUM: Potassium: 3.6 mEq/L (ref 3.5–5.1)

## 2012-12-12 NOTE — Progress Notes (Signed)
[facility] PROGRESS NOTE  NEWEL OIEN ZOX:[id number] DOB: [birth year] DOA: 12/11/2012 PCP: No primary provider on file.  Assessment/Plan:  Syncope: admitted to tele, cardiac enzymes neg. Echo pending. MRI brain pending.  Orthostatics pending.   DVT prophylaxis.      HPI/Subjective: dizziess on sitting up.  Objective: Filed Vitals:   12/12/12 1500 12/12/12 1700 12/12/12 1701 12/12/12 1702  BP: 175/80 173/82 168/72 163/70  Pulse: 65 63 61 66  Temp: 98.4 F (36.9 C)     TempSrc: Oral     Resp: 18     Height:      Weight:      SpO2: 95%       Intake/Output Summary (Last 24 hours) at 12/12/12 1959 Last data filed at 12/12/12 1426  Gross per 24 hour  Intake 1857.08 ml  Output   2025 ml  Net -167.92 ml   Filed Weights   12/11/12 1430  Weight: 101.1 kg (222 lb 14.2 oz)    Exam: Alert afebrile comfortable Lungs:  Normal respiratory effort. Clear to auscultation BL without crackles or wheezes.  Heart:  RRR. S1 and S2 normal without gallop or rubs.patient has 3/6 early systolic murmur best heard at left lower sternal border.  Abdomen:  BS normoactive. Soft, Nondistended, non-tender. No masses or organomegaly.  Extremities:  No pretibial edema. Neuro: dizziness , no focal deficits. No facial symmetry    Data Reviewed: Basic Metabolic Panel:  Recent Labs Lab 12/11/12 0918 12/12/12 1014  NA 138  --   K 3.1* 3.6  CL 102  --   CO2 23  --   GLUCOSE 141*  --   BUN 22  --   CREATININE 0.90  --   CALCIUM 9.2  --    Liver Function Tests: No results found for this basename: AST, ALT, ALKPHOS, BILITOT, PROT, ALBUMIN,  in the last 168 hours No results found for this basename: LIPASE, AMYLASE,  in the last 168 hours No results found for this basename: AMMONIA,  in the last 168 hours CBC:  Recent Labs Lab 12/11/12 0918  WBC 7.4  NEUTROABS 4.6  HGB 13.5  HCT 40.9  MCV 79.4  PLT 168   Cardiac Enzymes:  Recent Labs Lab 12/11/12 0919 12/11/12 1520  12/11/12 2002  TROPONINI <0.30 <0.30 <0.30   BNP (last 3 results) No results found for this basename: PROBNP,  in the last 8760 hours CBG: No results found for this basename: GLUCAP,  in the last 168 hours  No results found for this or any previous visit (from the past 240 hour(s)).   Studies: Ct Head Wo Contrast  12/11/2012  *RADIOLOGY REPORT*  Clinical Data:   Fall while exercising.  CT HEAD WITHOUT CONTRAST  Technique: Contiguous axial images were obtained from the base of the skull through the vertex without contrast  Comparison: Brain MR of 04/02/2009.  No prior cervical spine imaging.  Findings:  Bone windows demonstrate prosthetic left globe.  Scalp soft tissue swelling is mild to moderate posteriorly, near the vertex.  Example image 63/series 4.  Fluid within the left maxillary sinus.  Mucosal thickening of the right maxillary sinus.  Hypoplastic frontal sinuses. No skull fracture.  Soft tissue windows demonstrate no  mass lesion, hemorrhage, hydrocephalus, acute infarct, intra-axial, or extra-axial fluid collection.  IMPRESSION:  1.  Posterior scalp soft tissue swelling, without acute intracranial abnormality. 2.  Sinus disease.  CT CERVICAL SPINE WITHOUT CONTRAST  Technique: Continous axial images were obtained of the cervical  spine without contrast.  Sagittal and coronal reformats were constructed.  Findings:  Spinal visualization through the bottom of C7. Prevertebral soft tissues are within normal limits.  Dental hardware artifact at the C2 level.  Spondylosis.  This results in mild right-sided neural foraminal narrowing at C4-C5 and bilateral neural foraminal narrowing at C6- C7.  Skull base intact.  Straightening of expected cervical lordosis. Maintenance of vertebral body height. Facets are well-aligned.  Coronal reformats demonstrate a normal C1-C2 articulation.  .  IMPRESSION: 1.  Spondylosis, without acute fracture or subluxation. 2. Straightening of expected cervical lordosis could  be positional, due to muscular spasm, or ligamentous injury.   Original Report Authenticated By: Jeronimo Greaves, M.D.    Ct Cervical Spine Wo Contrast  12/11/2012  *RADIOLOGY REPORT*  Clinical Data:   Fall while exercising.  CT HEAD WITHOUT CONTRAST  Technique: Contiguous axial images were obtained from the base of the skull through the vertex without contrast  Comparison: Brain MR of 04/02/2009.  No prior cervical spine imaging.  Findings:  Bone windows demonstrate prosthetic left globe.  Scalp soft tissue swelling is mild to moderate posteriorly, near the vertex.  Example image 63/series 4.  Fluid within the left maxillary sinus.  Mucosal thickening of the right maxillary sinus.  Hypoplastic frontal sinuses. No skull fracture.  Soft tissue windows demonstrate no  mass lesion, hemorrhage, hydrocephalus, acute infarct, intra-axial, or extra-axial fluid collection.  IMPRESSION:  1.  Posterior scalp soft tissue swelling, without acute intracranial abnormality. 2.  Sinus disease.  CT CERVICAL SPINE WITHOUT CONTRAST  Technique: Continous axial images were obtained of the cervical spine without contrast.  Sagittal and coronal reformats were constructed.  Findings:  Spinal visualization through the bottom of C7. Prevertebral soft tissues are within normal limits.  Dental hardware artifact at the C2 level.  Spondylosis.  This results in mild right-sided neural foraminal narrowing at C4-C5 and bilateral neural foraminal narrowing at C6- C7.  Skull base intact.  Straightening of expected cervical lordosis. Maintenance of vertebral body height. Facets are well-aligned.  Coronal reformats demonstrate a normal C1-C2 articulation.  .  IMPRESSION: 1.  Spondylosis, without acute fracture or subluxation. 2. Straightening of expected cervical lordosis could be positional, due to muscular spasm, or ligamentous injury.   Original Report Authenticated By: Jeronimo Greaves, M.D.     Scheduled Meds: . aspirin EC  81 mg Oral Daily  .  citalopram  20 mg Oral Daily  . enoxaparin (LOVENOX) injection  40 mg Subcutaneous Q24H  . lisinopril  20 mg Oral Daily  . sodium chloride  3 mL Intravenous Q12H  . sodium chloride  3 mL Intravenous Q12H  . Tamsulosin HCl  0.4 mg Oral Daily   Continuous Infusions:   Principal Problem:   Syncope and collapse Active Problems:   HTN (hypertension)   HLD (hyperlipidemia)   Depression   Hypokalemia        Lee-Ann Gal  [facility] Pager [phone]. If 8PM-8AM, please contact night-coverage at [email or web address], password [facility] 12/12/2012, 7:59 PM  LOS: 1 day

## 2012-12-12 NOTE — Care Management Note (Addendum)
    Page 1 of 1   12/14/2012     3:13:45 PM   CARE MANAGEMENT NOTE 12/14/2012  Patient:  Joe [name], Joe [name]   Account Number:  [id number]  Date Initiated:  12/12/2012  Documentation initiated by:  GRAVES-BIGELOW,Madia Carvell  Subjective/Objective Assessment:   Pt admitted with syncope while at the gym.     Action/Plan:   CM will continue to monitor for disposition needs.   Anticipated DC Date:  12/13/2012   Anticipated DC Plan:  HOME/SELF CARE      DC Planning Services  CM consult      Choice offered to / List presented to:  C-1 Patient   DME arranged  Levan Hurst      DME agency  [facility]        Status of service:  Completed, signed off Medicare Important Message given?   (If response is "NO", the following Medicare IM given date fields will be blank) Date Medicare IM given:   Date Additional Medicare IM given:    Discharge Disposition:  HOME/SELF CARE  Per UR Regulation:  Reviewed for med. necessity/level of care/duration of stay  If discussed at Long Length of Stay Meetings, dates discussed:    Comments:  Cm sent fax for outpatient therapy at the [facility]. They will call pt to set up an appointment. Pt left without RW and plan is fro [facility] to deliver to home. No further services from CM at this time.

## 2012-12-12 NOTE — Progress Notes (Signed)
UR Completed Christopher Glasscock Graves-Bigelow, RN,BSN [phone]  

## 2012-12-12 NOTE — Progress Notes (Signed)
  Echocardiogram 2D Echocardiogram has been performed.  Cathie Beams 12/12/2012, 8:55 AM

## 2012-12-13 ENCOUNTER — Observation Stay (HOSPITAL_COMMUNITY): Payer: Medicare Other

## 2012-12-13 DIAGNOSIS — H8110 Benign paroxysmal vertigo, unspecified ear: Secondary | ICD-10-CM | POA: Diagnosis present

## 2012-12-13 MED ORDER — MECLIZINE HCL 12.5 MG PO TABS
12.5000 mg | ORAL_TABLET | Freq: Two times a day (BID) | ORAL | Status: DC
  Administered 2012-12-13: 12.5 mg via ORAL
  Filled 2012-12-13 (×2): qty 1

## 2012-12-13 MED ORDER — LEVOFLOXACIN 500 MG PO TABS
500.0000 mg | ORAL_TABLET | Freq: Every day | ORAL | Status: DC
  Administered 2012-12-13 – 2012-12-14 (×2): 500 mg via ORAL
  Filled 2012-12-13 (×2): qty 1

## 2012-12-13 MED ORDER — HYDRALAZINE HCL 20 MG/ML IJ SOLN: 2.0000 mg | Freq: Four times a day (QID) | INTRAMUSCULAR | Status: DC | PRN

## 2012-12-13 MED ORDER — MECLIZINE HCL 12.5 MG PO TABS
12.5000 mg | ORAL_TABLET | Freq: Three times a day (TID) | ORAL | Status: DC | PRN
  Filled 2012-12-13: qty 1

## 2012-12-13 NOTE — Progress Notes (Signed)
Vestibular reassessment   12/13/12 1539  PT Visit Information  Last PT Received On 12/13/12  Assistance Needed +1  PT Time Calculation  PT Start Time 1506  PT Stop Time 1537  PT Time Calculation (min) 31 min  Subjective Data  Subjective I feel better, but it's definitely not gone  Precautions  Precautions Fall  Cognition  Overall Cognitive Status Appears within functional limits for tasks assessed/performed  Arousal/Alertness Awake/alert  Orientation Level Appears intact for tasks assessed  Behavior During Session [facility] for tasks performed  Bed Mobility  Bed Mobility Supine to Sit;Sit to Supine  Supine to Sit 7: Independent  Sit to Supine 7: Independent  Transfers  Transfers Sit to Stand;Stand to Sit  Sit to Stand 4: Min assist;From bed;Other (comment) (due to dysequilibrium)  Stand to Sit 4: Min assist  Details for Transfer Assistance due to vertigo staying close  Ambulation/Gait  Ambulation/Gait Assistance Not tested (comment)  Standardized Balance Assessment  Standardized Balance Assessment Vestibular Evaluation  PT - End of Session  Activity Tolerance Patient tolerated treatment well;Other (comment)  Patient left in bed;with call bell/phone within reach;with family/visitor present;Other (comment)  Nurse Communication Mobility status  PT - Assessment/Plan  Comments on Treatment Session Assessed vestibular system further.  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  Treated with Epply.  Concerning that nystagmus and s/s would recede and return for short periods and upon sitting the nystagmus and s/s continued longer than expected.  Will further assess 2/26  PT Plan Discharge plan remains appropriate;Frequency remains appropriate  PT Frequency Min 2X/week  Follow Up Recommendations Outpatient PT  PT equipment None recommended by PT  Acute Rehab PT Goals  PT Goal Formulation With patient  Time For Goal Achievement 12/20/12   Potential to Achieve Goals Good  Additional Goals  Additional Goal #1 pt will demostrate ability to mobilize with s/s of vertigo <3/10 after further treatment for BPPV  PT Goal: Additional Goal #1 - Progress Progressing toward goal  PT General Charges  $$ ACUTE PT VISIT 1 Procedure  PT Treatments  $Neuromuscular Re-education 23-37 mins  12/13/2012  Archer Bing, PT [phone] (pager)

## 2012-12-13 NOTE — Progress Notes (Signed)
Pt. Brought in home CPAP unit. [facility] unit was removed from pt. Room.  Pt. Encouraged to call Respiratory if needed.

## 2012-12-13 NOTE — Progress Notes (Signed)
[facility] PROGRESS NOTE  Joe [name] ZOX:[id number] DOB: 11/11/1940 DOA: 12/11/2012 PCP: No primary provider on file.  Assessment/Plan:  Syncope: possibly vaso vagal vs orthostatic. His cardiac enzymes have been negative. Echo done showed normal LVEF, no regional wall abnormalities and mild to mod elevated pulmonary artery pressures . EKG does not show ischemic changes. Will repeat EKG today. His orthostatics BP measurements were appropriate. Mri Brain is not significant for acute CVA. He has a posterior scalp hematoma and some sinusitis.   Dizziness possibly Vertigo from BPPV: on meclizine for symptomatic relief. PT working with him . Will require outpatient vestibular PT on discharge.   Hypertension: suboptimal. Probably from the intense dizziness. He is resumed on home lisinopril. Added prn hydralazine.   Hypokalemia : repleted.   HPI/Subjective: dizziess on sitting up.  Objective: Filed Vitals:   12/12/12 2215 12/13/12 0500 12/13/12 0642 12/13/12 1420  BP: 170/82 185/98 168/84 184/82  Pulse:  54 56 65  Temp:  97.6 F (36.4 C)  97.6 F (36.4 C)  TempSrc:  Oral    Resp:  16  18  Height:      Weight:  101.379 kg (223 lb 8 oz)    SpO2:  98%      Intake/Output Summary (Last 24 hours) at 12/13/12 1516 Last data filed at 12/13/12 1300  Gross per 24 hour  Intake    600 ml  Output   1450 ml  Net   -850 ml   Filed Weights   12/11/12 1430 12/13/12 0500  Weight: 101.1 kg (222 lb 14.2 oz) 101.379 kg (223 lb 8 oz)    Exam: Alert afebrile comfortable Lungs:  Normal respiratory effort. Clear to auscultation BL without crackles or wheezes.  Heart:  RRR. S1 and S2 normal without gallop or rubs.patient has 3/6 early systolic murmur best heard at left lower sternal border.  Abdomen:  BS normoactive. Soft, Nondistended, non-tender. No masses or organomegaly.  Extremities:  No pretibial edema. Neuro: dizziness , no focal deficits. No facial symmetry    Data  Reviewed: Basic Metabolic Panel:  Recent Labs Lab 12/11/12 0918 12/12/12 1014  NA 138  --   K 3.1* 3.6  CL 102  --   CO2 23  --   GLUCOSE 141*  --   BUN 22  --   CREATININE 0.90  --   CALCIUM 9.2  --    Liver Function Tests: No results found for this basename: AST, ALT, ALKPHOS, BILITOT, PROT, ALBUMIN,  in the last 168 hours No results found for this basename: LIPASE, AMYLASE,  in the last 168 hours No results found for this basename: AMMONIA,  in the last 168 hours CBC:  Recent Labs Lab 12/11/12 0918  WBC 7.4  NEUTROABS 4.6  HGB 13.5  HCT 40.9  MCV 79.4  PLT 168   Cardiac Enzymes:  Recent Labs Lab 12/11/12 0919 12/11/12 1520 12/11/12 2002  TROPONINI <0.30 <0.30 <0.30   BNP (last 3 results) No results found for this basename: PROBNP,  in the last 8760 hours CBG: No results found for this basename: GLUCAP,  in the last 168 hours  No results found for this or any previous visit (from the past 240 hour(s)).   Studies: Mr [name] Contrast  12/13/2012  *RADIOLOGY REPORT*  Clinical Data: Dizziness.  MRI HEAD WITHOUT CONTRAST  Technique:  Multiplanar, multiecho pulse sequences of the brain and surrounding structures were obtained according to standard protocol without intravenous contrast.  Comparison: CT head  without contrast 12/11/2012.  Findings: No acute intracranial abnormality is present.  There is no evidence for acute infarct, hemorrhage, mass lesion.  Mild generalized atrophy is advanced for age.  The posterior scalp hematoma is again noted.  The ventricles are of normal size.  No significant extra-axial fluid collection is present.  The right vertebral artery is the dominant vessel.  The left vertebral artery is hypoplastic, likely terminating at the PICA. Flow is present in the major intracranial arteries.  A fluid level is present in the left maxillary sinus.  Mild mucosal thickening is present in both maxillary sinuses.  The mastoid air cells are clear.   IMPRESSION:  1.  Posterior scalp hematoma. 2.  No acute intracranial abnormality. 3.  Mild age advanced atrophy. 4.  Left maxillary sinusitis.   Original Report Authenticated By: Marin Roberts, M.D.     Scheduled Meds: . aspirin EC  81 mg Oral Daily  . citalopram  20 mg Oral Daily  . enoxaparin (LOVENOX) injection  40 mg Subcutaneous Q24H  . levofloxacin  500 mg Oral Daily  . lisinopril  20 mg Oral Daily  . sodium chloride  3 mL Intravenous Q12H  . sodium chloride  3 mL Intravenous Q12H  . tamsulosin  0.4 mg Oral Daily   Continuous Infusions:   Principal Problem:   Syncope and collapse Active Problems:   HTN (hypertension)   HLD (hyperlipidemia)   Depression   Hypokalemia        [name]  [facility] Pager [phone]. If 8PM-8AM, please contact night-coverage at [email or web address], password [facility] 12/13/2012, 3:16 PM  LOS: 2 days

## 2012-12-13 NOTE — Evaluation (Addendum)
Physical Therapy Evaluation Patient Details Name: Joe [name] MRN: [id number] DOB: [birth year] Today's Date: 12/13/2012 Time: 4098-1191 PT Time Calculation (min): 31 min  PT Assessment / Plan / Recommendation Clinical Impression  Primarily a Vestibular evaluation--Based on answers to subjective questions, suspected Posterior BPPV, primarily expecting L side; However, hallpike-Dix to R produced significant rotary nystamus.  Treated with the Epply.  S/S continued though with Nausea and eventually vomiting.  While waiting for nausea to subside, checked  horizontal canal with Supine head roll, not expecting any s/s, but produced the unexpected finding of rotary (instead of horizontal) nystagmus to the R, nothing L.  I have been unable to check L post. BPPV due to N/V or revisit epply for further R post BPPV.  Will recheck as able this pm.    PT Assessment  Patient needs continued PT services    Follow Up Recommendations  Outpatient PT    Does the patient have the potential to tolerate intense rehabilitation      Barriers to Discharge        Equipment Recommendations  None recommended by PT    Recommendations for Other Services     Frequency Min 2X/week    Precautions / Restrictions Precautions Precautions: Fall   Pertinent Vitals/Pain Period of high HR coinciding with vomiting       Mobility  Bed Mobility Bed Mobility: Supine to Sit;Sit to Supine Supine to Sit: 7: Independent Sit to Supine: 7: Independent Transfers Transfers: Sit to Stand;Stand to Sit Sit to Stand: 4: Min assist;From bed;Other (comment) (due to dysequilibrium) Stand to Sit: 4: Min assist Details for Transfer Assistance: due to vertigo staying close Ambulation/Gait Ambulation/Gait Assistance: Not tested (comment) Ambulation Distance (Feet): 150 Feet Assistive device: None;1 person hand held assist Ambulation/Gait Assistance Details: pt feeling more dysequilibrium than he shows, but very guarded due to  vertigo. Gait Pattern: Within Functional Limits    Exercises     PT Diagnosis: Other (comment) (BPPV)  PT Problem List: Other (comment) (dysequilibrium, BPPV) PT Treatment Interventions: Other (comment) (Vestibular treatment for BPPV)   PT Goals Acute Rehab PT Goals PT Goal Formulation: With patient Time For Goal Achievement: 12/20/12 Potential to Achieve Goals: Good Additional Goals Additional Goal #1: pt will demostrate ability to mobilize with s/s of vertigo <3/10 after further treatment for BPPV PT Goal: Additional Goal #1 - Progress: Progressing toward goal  Visit Information  Last PT Received On: 12/13/12 Assistance Needed: +1    Subjective Data  Subjective: I feel better, but it's definitely not gone Patient Stated Goal: Dizziness gone   Prior Functioning  Home Living Lives With: Spouse Available Help at Discharge: Family Type of Home: House Prior Function Level of Independence: Independent Able to Take Stairs?: Yes Driving: Yes Vocation: Part time employment Communication Communication: No difficulties    Cognition  Cognition Overall Cognitive Status: Appears within functional limits for tasks assessed/performed Arousal/Alertness: Awake/alert Orientation Level: Appears intact for tasks assessed Behavior During Session: [facility] for tasks performed    Extremity/Trunk Assessment Right Lower Extremity Assessment RLE ROM/Strength/Tone: Within functional levels Left Lower Extremity Assessment LLE ROM/Strength/Tone: Within functional levels Trunk Assessment Trunk Assessment: Normal   Balance Standardized Balance Assessment Standardized Balance Assessment: Vestibular Evaluation  End of Session PT - End of Session Activity Tolerance: Patient tolerated treatment well;Other (comment) Patient left: in bed;with call bell/phone within reach;with family/visitor present;Other (comment) Nurse Communication: Mobility status  GP Functional Assessment Tool Used: clinical  judgement Functional Limitation: Mobility: Walking and moving around Mobility: Walking  and Moving Around Current Status [phone]): At least 1 percent but less than 20 percent impaired, limited or restricted Mobility: Walking and Moving Around Goal Status [phone]): At least 1 percent but less than 20 percent impaired, limited or restricted   [name], Eliseo Gum 12/13/2012, 3:47 PM  12/13/2012  [place] Bing, PT [phone] (pager)

## 2012-12-14 DIAGNOSIS — R55 Syncope and collapse: Secondary | ICD-10-CM

## 2012-12-14 MED ORDER — MECLIZINE HCL 25 MG PO TABS: 25.0000 mg | ORAL_TABLET | Freq: Two times a day (BID) | ORAL | Status: DC

## 2012-12-14 MED ORDER — LORATADINE 10 MG PO TABS
10.0000 mg | ORAL_TABLET | Freq: Every day | ORAL | Status: DC
Start: 2012-12-14 — End: 2012-12-14
  Filled 2012-12-14: qty 1

## 2012-12-14 MED ORDER — LORATADINE 10 MG PO TABS: 10.0000 mg | ORAL_TABLET | Freq: Every day | ORAL | Status: DC

## 2012-12-14 MED ORDER — MECLIZINE HCL 25 MG PO TABS
25.0000 mg | ORAL_TABLET | Freq: Two times a day (BID) | ORAL | Status: DC
  Administered 2012-12-14: 25 mg via ORAL
  Filled 2012-12-14 (×2): qty 1

## 2012-12-14 MED ORDER — AMLODIPINE BESYLATE 5 MG PO TABS
5.0000 mg | ORAL_TABLET | Freq: Every day | ORAL | Status: DC
  Filled 2012-12-14: qty 1

## 2012-12-14 MED ORDER — UNABLE TO FIND: Status: DC

## 2012-12-14 MED ORDER — LEVOFLOXACIN 500 MG PO TABS: 500.0000 mg | ORAL_TABLET | Freq: Every day | ORAL | Status: DC

## 2012-12-14 NOTE — Progress Notes (Signed)
Pt and pt wife provided with dc instructions and education. Verbalized understanding. Pt provided with prescriptions and edcuated on new medications. Pt able to [employer]. IV removed with tip intact. Heart monitor cleaned and returned to front. Pt taking shower prior to dc. WIll leave by wheelchair for home with wife. Levonne Spiller, RN

## 2012-12-14 NOTE — Progress Notes (Signed)
RNCM asked CSW to speak with patient and his wife regarding ?placement. Patient does not require SNF level of care and I thus I advised patient he could consider ALF at d/c if he desired- this would not be covered by his insurance and would be an out of pocket expense. Patient and his wife decline and wish to pursue d/c to home with outpatient therapies. No CSW needs identified-    Reece Levy, MSW, [employer] [phone]

## 2012-12-14 NOTE — Discharge Summary (Signed)
Physician Discharge Summary  Patient ID: Joe [name] MRN: [id number] DOB/AGE: 1941/06/06 72 y.o.  Admit date: 12/11/2012 Discharge date: 12/14/2012  Primary Care Physician:  Joe Raider, MD  Discharge Diagnoses:   . Benign paroxysmal positional vertigo . Syncope and collapse . HTN (hypertension) . HLD (hyperlipidemia) . Depression . Hypokalemia   Consults: none   Discharge Instructions: 1) Out Patient physical therapy and vestibular rehabilitation 2) continue scheduled meclizine for a week then as needed 3) if vertigo symptoms continue to persist after a week, please refer patient to ENT   Discharge Medications:   Medication List    TAKE these medications       aspirin EC 81 MG tablet  Take 81 mg by mouth daily.     citalopram 20 MG tablet  Commonly known as:  CELEXA  Take 20 mg by mouth daily.     Fish Oil 1000 MG Caps  Take 1 capsule by mouth daily.     levofloxacin 500 MG tablet  Commonly known as:  LEVAQUIN  Take 1 tablet (500 mg total) by mouth daily. X 7 days     lisinopril-hydrochlorothiazide 20-25 MG per tablet  Commonly known as:  PRINZIDE,ZESTORETIC  Take 1 tablet by mouth daily.     loratadine 10 MG tablet  Commonly known as:  CLARITIN  Take 1 tablet (10 mg total) by mouth daily.     meclizine 25 MG tablet  Commonly known as:  ANTIVERT  Take 1 tablet (25 mg total) by mouth 2 (two) times daily.     simvastatin 20 MG tablet  Commonly known as:  ZOCOR  Take 20 mg by mouth daily.     tamsulosin 0.4 MG Caps  Commonly known as:  FLOMAX  Take 0.4 mg by mouth daily.     UNABLE TO FIND  Outpatient PHYSICAL THERAPY AND VESTIBULAR REHAB      Diagnosis: Benign positional vertigo         Brief H and P: For complete details please refer to admission H and P, but in brief Patient is a 72 year of age with past medical history most significant for family history of premature coronary artery disease, past smoker who quit in 1999,  hypertension, hyperlipidemia and depression who was brought to the ER for evaluation after a fall. Patient recently joined a local gym and today was his third gym session. Patient was working out at [employer], walking on the treadmill for about 10 minutes prior to the episode. He got off of the treadmill and fell backwards, hitting his head on the ground. There was loss of consciousness. Patient does not remember the episode. EMS responded to the scene and found him still unresponsive. They reported that he was unresponsive for a period of approximately 5 minutes and then became agitated and combative. Patient was initially tachycardic with heart rate as high as 190 as per EMS, slowly became normal sinus rhythm. EMS reports that the patient has slowly become more lucid, now alert and oriented. At arrival to the ER, patient had no complaints. He denied chest pain, shortness of breath, palpitations, abdominal pain. No recent illness.   Hospital Course: Patient was admitted with a syncopal episode while working out at the gym. Syncope: possibly vaso vagal vs orthostatic. Orthostatic vitals did show drop in BP by 10 points on standing up. His cardiac enzymes have been negative. Echo done showed normal LVEF of 60-65%, no regional wall abnormalities and mild elevated pulmonary artery pressures,71mmhg . EKG did  not show ischemic changes. MRI Brain was done and showed no acute CVA, left maxillary sinusitis. He has a posterior scalp hematoma and some sinusitis.  Dizziness possibly Vertigo from BPPV: on meclizine for symptomatic relief. Physical therapy was consulted and patient underwent a vestibular exercises, and was recommended outpatient vestibule physical therapy on discharge. Patient also received gentle hydration. Hypertension: suboptimal. Probably from the intense dizziness. He will resume his outpatient antihypertensives. Hypokalemia : repleted.    Day of Discharge BP 183/83  Pulse 61  Temp(Src) 97.5 F  (36.4 C) (Oral)  Resp 20  Ht 5\' 7"  (1.702 m)  Wt 100.971 kg (222 lb 9.6 oz)  BMI 34.86 kg/m2  SpO2 96%  Physical Exam: General: Alert and awake oriented x3 not in any acute distress. HEENT: anicteric sclera, pupils reactive to light and accommodation CVS: S1-S2 clear, 3/6 SEM at left sternal border Chest: clear to auscultation bilaterally, no wheezing rales or rhonchi Abdomen: soft nontender, nondistended, normal bowel sounds, no organomegaly Extremities: no cyanosis, clubbing or edema noted bilaterally Neuro: Cranial nerves II-XII intact, no focal neurological deficits, finger to nose intact   The results of significant diagnostics from this hospitalization (including imaging, microbiology, ancillary and laboratory) are listed below for reference.    LAB RESULTS: Basic Metabolic Panel:  Recent Labs Lab 12/11/12 0918 12/12/12 1014  NA 138  --   K 3.1* 3.6  CL 102  --   CO2 23  --   GLUCOSE 141*  --   BUN 22  --   CREATININE 0.90  --   CALCIUM 9.2  --    CBC:  Recent Labs Lab 12/11/12 0918  WBC 7.4  NEUTROABS 4.6  HGB 13.5  HCT 40.9  MCV 79.4  PLT 168   Cardiac Enzymes:  Recent Labs Lab 12/11/12 1520 12/11/12 2002  TROPONINI <0.30 <0.30   BNP: No components found with this basename: POCBNP,  CBG: No results found for this basename: GLUCAP,  in the last 168 hours  Significant Diagnostic Studies:  Ct Head Wo Contrast  12/11/2012  *RADIOLOGY REPORT*  Clinical Data:   Fall while exercising.  CT HEAD WITHOUT CONTRAST  Technique: Contiguous axial images were obtained from the base of the skull through the vertex without contrast  Comparison: Brain MR of 04/02/2009.  No prior cervical spine imaging.  Findings:  Bone windows demonstrate prosthetic left globe.  Scalp soft tissue swelling is mild to moderate posteriorly, near the vertex.  Example image 63/series 4.  Fluid within the left maxillary sinus.  Mucosal thickening of the right maxillary sinus.   Hypoplastic frontal sinuses. No skull fracture.  Soft tissue windows demonstrate no  mass lesion, hemorrhage, hydrocephalus, acute infarct, intra-axial, or extra-axial fluid collection.  IMPRESSION:  1.  Posterior scalp soft tissue swelling, without acute intracranial abnormality. 2.  Sinus disease.  CT CERVICAL SPINE WITHOUT CONTRAST  Technique: Continous axial images were obtained of the cervical spine without contrast.  Sagittal and coronal reformats were constructed.  Findings:  Spinal visualization through the bottom of C7. Prevertebral soft tissues are within normal limits.  Dental hardware artifact at the C2 level.  Spondylosis.  This results in mild right-sided neural foraminal narrowing at C4-C5 and bilateral neural foraminal narrowing at C6- C7.  Skull base intact.  Straightening of expected cervical lordosis. Maintenance of vertebral body height. Facets are well-aligned.  Coronal reformats demonstrate a normal C1-C2 articulation.  .  IMPRESSION: 1.  Spondylosis, without acute fracture or subluxation. 2. Straightening of expected cervical  lordosis could be positional, due to muscular spasm, or ligamentous injury.   Original Report Authenticated By: Jeronimo Greaves, M.D.    Ct Cervical Spine Wo Contrast  12/11/2012  *RADIOLOGY REPORT*  Clinical Data:   Fall while exercising.  CT HEAD WITHOUT CONTRAST  Technique: Contiguous axial images were obtained from the base of the skull through the vertex without contrast  Comparison: Brain MR of 04/02/2009.  No prior cervical spine imaging.  Findings:  Bone windows demonstrate prosthetic left globe.  Scalp soft tissue swelling is mild to moderate posteriorly, near the vertex.  Example image 63/series 4.  Fluid within the left maxillary sinus.  Mucosal thickening of the right maxillary sinus.  Hypoplastic frontal sinuses. No skull fracture.  Soft tissue windows demonstrate no  mass lesion, hemorrhage, hydrocephalus, acute infarct, intra-axial, or extra-axial fluid  collection.  IMPRESSION:  1.  Posterior scalp soft tissue swelling, without acute intracranial abnormality. 2.  Sinus disease.  CT CERVICAL SPINE WITHOUT CONTRAST  Technique: Continous axial images were obtained of the cervical spine without contrast.  Sagittal and coronal reformats were constructed.  Findings:  Spinal visualization through the bottom of C7. Prevertebral soft tissues are within normal limits.  Dental hardware artifact at the C2 level.  Spondylosis.  This results in mild right-sided neural foraminal narrowing at C4-C5 and bilateral neural foraminal narrowing at C6- C7.  Skull base intact.  Straightening of expected cervical lordosis. Maintenance of vertebral body height. Facets are well-aligned.  Coronal reformats demonstrate a normal C1-C2 articulation.  .  IMPRESSION: 1.  Spondylosis, without acute fracture or subluxation. 2. Straightening of expected cervical lordosis could be positional, due to muscular spasm, or ligamentous injury.   Original Report Authenticated By: Jeronimo Greaves, M.D.     2D ECHO:   Disposition and Follow-up: Discharge Orders   Future Orders Complete By Expires     Diet - low sodium heart healthy  As directed     Discharge instructions  As directed     Comments:      please continue scheduled meclizine twice daily for a week, then as needed for vertigo    Increase activity slowly  As directed         DISPOSITION: Home  DIET: Heart healthy diet ACTIVITY: As tolerated  DISCHARGE FOLLOW-UP Follow-up Information   Follow up with SHAW,KIMBERLEE, MD. Schedule an appointment as soon as possible for a visit in 10 days. (for hospital follow-up)    Contact information:   [address] [place] [address] [phone]       Time spent on Discharge: 40 mins  Signed:   Kerilyn Cortner M.D. [facility] 12/14/2012, 1:34 PM Pager: [phone]

## 2012-12-14 NOTE — Progress Notes (Signed)
Physical Therapy Treatment Patient Details Name: Joe [name] MRN: [id number] DOB: [birth year] Today's Date: 12/14/2012 Time: 1006-1050 PT Time Calculation (min): 44 min  PT Assessment / Plan / Recommendation Comments on Treatment Session  Continued to assess and treat pt from a vestibular standpoint--Both R and L sides showed some rotary nystagmus, L much worse than R.  Both sides treated with Epply maneuver and then pt given Brandt-Daroff exercise to hopefull take care of any further problems.  Have asked for an OPPT vestibular script in case pt needs to visit a vestibular Pt.  No further acute needs at this time.  Will sign off.    Follow Up Recommendations  Outpatient PT     Does the patient have the potential to tolerate intense rehabilitation     Barriers to Discharge        Equipment Recommendations  None recommended by PT    Recommendations for Other Services    Frequency     Plan Discharge plan remains appropriate;Frequency remains appropriate    Precautions / Restrictions Precautions Precautions: Fall   Pertinent Vitals/Pain     Mobility  Bed Mobility Bed Mobility: Supine to Sit;Sit to Supine Supine to Sit: 7: Independent Sit to Supine: 7: Independent Transfers Transfers: Sit to Stand;Stand to Sit Sit to Stand: 5: Supervision;With upper extremity assist;From bed;From toilet Stand to Sit: 5: Supervision;With upper extremity assist;To toilet;To chair/3-in-1 Details for Transfer Assistance: safe while holding to something Ambulation/Gait Ambulation Distance (Feet): 250 Feet Assistive device: None;1 person hand held assist Ambulation/Gait Assistance Details: still guarded and unsteady, but maintains balance with no assist with exception of one instance. Gait Pattern: Within Functional Limits Gait velocity: slow and deliberate while still using compensations to diminish s/s of vertigo Stairs: Yes Stairs Assistance: 5: Supervision Stairs Assistance Details (indicate  cue type and reason): guarded with some vertigo remaining Stair Management Technique: One rail Right;Two rails;Alternating pattern;Step to pattern;Forwards (2 rail, step to going down) Number of Stairs: 3 Wheelchair Mobility Wheelchair Mobility: No    Exercises     PT Diagnosis:    PT Problem List:   PT Treatment Interventions:     PT Goals Acute Rehab PT Goals PT Goal Formulation: With patient Time For Goal Achievement: 12/20/12 Potential to Achieve Goals: Good Additional Goals Additional Goal #1: pt will demostrate ability to mobilize with s/s of vertigo <3/10 after further treatment for BPPV PT Goal: Additional Goal #1 - Progress: Progressing toward goal  Visit Information  Last PT Received On: 12/14/12    Subjective Data  Subjective: Oh, it feels much better, but it's there a little bit.Marland KitchenMarland KitchenMarland KitchenI haven't been this weak in a while.   Cognition  Cognition Overall Cognitive Status: Appears within functional limits for tasks assessed/performed Arousal/Alertness: Awake/alert Orientation Level: Appears intact for tasks assessed Behavior During Session: [facility] for tasks performed    Balance  Standardized Balance Assessment Standardized Balance Assessment: Vestibular Evaluation  End of Session PT - End of Session Activity Tolerance: Patient tolerated treatment well Patient left: in chair;with call bell/phone within reach Nurse Communication: Mobility status   GP Mobility: Walking and Moving Around Discharge Status [phone]): At least 1 percent but less than 20 percent impaired, limited or restricted   Jaqulyn Chancellor, Eliseo Gum 12/14/2012, 11:08 AM 12/14/2012  [place] Bing, PT [phone] (pager)

## 2012-12-14 NOTE — Progress Notes (Signed)
Pt. Has home CPAP unit and is able to place himself on /off.  Pt. Is encouraged to call if help needed.

## 2012-12-16 ENCOUNTER — Ambulatory Visit: Payer: Medicare Other | Attending: Internal Medicine | Admitting: Physical Therapy

## 2012-12-16 DIAGNOSIS — H8110 Benign paroxysmal vertigo, unspecified ear: Secondary | ICD-10-CM | POA: Insufficient documentation

## 2012-12-16 DIAGNOSIS — R269 Unspecified abnormalities of gait and mobility: Secondary | ICD-10-CM | POA: Insufficient documentation

## 2012-12-16 DIAGNOSIS — IMO0001 Reserved for inherently not codable concepts without codable children: Secondary | ICD-10-CM | POA: Insufficient documentation

## 2012-12-20 ENCOUNTER — Ambulatory Visit: Payer: Medicare Other | Admitting: Physical Therapy

## 2012-12-21 ENCOUNTER — Ambulatory Visit: Payer: Medicare Other | Attending: Internal Medicine | Admitting: Physical Therapy

## 2012-12-21 DIAGNOSIS — R269 Unspecified abnormalities of gait and mobility: Secondary | ICD-10-CM | POA: Insufficient documentation

## 2012-12-21 DIAGNOSIS — H8110 Benign paroxysmal vertigo, unspecified ear: Secondary | ICD-10-CM | POA: Insufficient documentation

## 2012-12-21 DIAGNOSIS — IMO0001 Reserved for inherently not codable concepts without codable children: Secondary | ICD-10-CM | POA: Insufficient documentation

## 2012-12-23 ENCOUNTER — Encounter: Payer: Medicare Other | Admitting: Physical Therapy

## 2012-12-26 ENCOUNTER — Ambulatory Visit: Payer: Medicare Other | Admitting: Physical Therapy

## 2012-12-28 ENCOUNTER — Ambulatory Visit: Payer: Medicare Other | Admitting: Physical Therapy

## 2013-01-04 ENCOUNTER — Ambulatory Visit: Payer: Medicare Other | Admitting: Physical Therapy

## 2013-01-05 ENCOUNTER — Ambulatory Visit: Payer: Medicare Other | Admitting: Physical Therapy

## 2013-01-06 ENCOUNTER — Encounter: Payer: Medicare Other | Admitting: Physical Therapy

## 2013-01-09 ENCOUNTER — Encounter: Payer: Medicare Other | Admitting: Physical Therapy

## 2013-01-12 ENCOUNTER — Ambulatory Visit: Payer: Medicare Other | Admitting: Physical Therapy

## 2013-01-20 ENCOUNTER — Ambulatory Visit: Payer: Medicare Other | Attending: Internal Medicine | Admitting: Physical Therapy

## 2013-01-20 DIAGNOSIS — IMO0001 Reserved for inherently not codable concepts without codable children: Secondary | ICD-10-CM | POA: Insufficient documentation

## 2013-01-20 DIAGNOSIS — H8110 Benign paroxysmal vertigo, unspecified ear: Secondary | ICD-10-CM | POA: Insufficient documentation

## 2013-01-20 DIAGNOSIS — R269 Unspecified abnormalities of gait and mobility: Secondary | ICD-10-CM | POA: Insufficient documentation

## 2013-01-23 ENCOUNTER — Ambulatory Visit: Payer: Medicare Other | Admitting: Rehabilitative and Restorative Service Providers"

## 2013-01-30 ENCOUNTER — Ambulatory Visit: Payer: Medicare Other | Admitting: Physical Therapy

## 2013-02-07 ENCOUNTER — Ambulatory Visit: Payer: Medicare Other | Admitting: Rehabilitative and Restorative Service Providers"

## 2013-02-21 ENCOUNTER — Ambulatory Visit: Payer: Medicare Other | Admitting: Rehabilitative and Restorative Service Providers"

## 2013-02-23 ENCOUNTER — Ambulatory Visit: Payer: Medicare Other | Attending: Internal Medicine | Admitting: Rehabilitative and Restorative Service Providers"

## 2013-02-23 DIAGNOSIS — H8110 Benign paroxysmal vertigo, unspecified ear: Secondary | ICD-10-CM | POA: Insufficient documentation

## 2013-02-23 DIAGNOSIS — IMO0001 Reserved for inherently not codable concepts without codable children: Secondary | ICD-10-CM | POA: Insufficient documentation

## 2013-02-23 DIAGNOSIS — R269 Unspecified abnormalities of gait and mobility: Secondary | ICD-10-CM | POA: Insufficient documentation

## 2013-04-14 ENCOUNTER — Other Ambulatory Visit: Payer: Self-pay | Admitting: Family Medicine

## 2013-04-14 DIAGNOSIS — R17 Unspecified jaundice: Secondary | ICD-10-CM

## 2013-04-20 ENCOUNTER — Ambulatory Visit
Admission: RE | Admit: 2013-04-20 | Discharge: 2013-04-20 | Disposition: A | Discharge status: Home / Self Care | Payer: Medicare Other | Source: Ambulatory Visit | Attending: Family Medicine | Admitting: Family Medicine

## 2013-04-20 DIAGNOSIS — R17 Unspecified jaundice: Secondary | ICD-10-CM

## 2013-04-27 ENCOUNTER — Other Ambulatory Visit: Payer: Medicare Other

## 2013-10-22 DIAGNOSIS — M47816 Spondylosis without myelopathy or radiculopathy, lumbar region: Secondary | ICD-10-CM | POA: Insufficient documentation

## 2014-06-07 ENCOUNTER — Ambulatory Visit (INDEPENDENT_AMBULATORY_CARE_PROVIDER_SITE_OTHER): Payer: Medicare Other | Admitting: Cardiovascular Disease

## 2014-06-07 ENCOUNTER — Encounter: Payer: Self-pay | Admitting: Cardiovascular Disease

## 2014-06-07 VITALS — BP 154/78 | HR 67 | Ht 65.0 in | Wt 231.0 lb

## 2014-06-07 DIAGNOSIS — I10 Essential (primary) hypertension: Secondary | ICD-10-CM

## 2014-06-07 DIAGNOSIS — R55 Syncope and collapse: Secondary | ICD-10-CM

## 2014-06-07 DIAGNOSIS — Z01810 Encounter for preprocedural cardiovascular examination: Secondary | ICD-10-CM

## 2014-06-07 DIAGNOSIS — I4510 Unspecified right bundle-branch block: Secondary | ICD-10-CM | POA: Insufficient documentation

## 2014-06-07 NOTE — Progress Notes (Signed)
Patient ID: Joe [name], male   DOB: 1940-11-04, 73 y.o.   MRN: [id number]  73 yo referred by Murphy/Wainer for preop clearance orthopedic surgery Has had scope of both knees and right shoulder with general anesthesia with no issues.  No bleeding diathesis.  No history of chest pain or CAD.  Until about 6 weeks ago going to gym for light weights and cardio with no issues.  Increasing pain right knee.  Wants to delay surgery until 11/30 when [facility] football schedule done as he has seasons tickets.  He is a retired Geologist, engineering  .  Denies sleep apnea despite body habitus  HTN on Rx No history of DVT but mild edema from obesity  No premature family history of sudden death or heart issues.  Quit smoking in 98   [facility] "[facility]"  [address] at gym thought to be postural/vertigo with negative w/u r/o normal telemetry and baseline ECG with RBBB no bradycardia or tachycardia identified Study Conclusions  - Left ventricle: The cavity size was normal. There was mild concentric hypertrophy. Systolic function was normal. The estimated ejection fraction was in the range of 60% to 65%. Wall motion was normal; there were no regional wall motion abnormalities. - Left atrium: The atrium was mildly dilated. - Pulmonary arteries: Systolic pressure was mildly increased. PA peak pressure: 8mm Hg (S).   ROS: Denies fever, malais, weight loss, blurry vision, decreased visual acuity, cough, sputum, SOB, hemoptysis, pleuritic pain, palpitaitons, heartburn, abdominal pain, melena, lower extremity edema, claudication, or rash.  All other systems reviewed and negative   General: Affect appropriate Obese white male  HEENT: normal Neck supple with no adenopathy JVP normal no bruits no thyromegaly Lungs clear with no wheezing and good diaphragmatic motion Heart:  S1/S2 no murmur,rub, gallop or click PMI normal Abdomen: benighn, BS positve, no tenderness, no AAA no bruit.  No HSM or HJR Distal pulses intact  with no bruits Trace bilateral LE  edema Neuro non-focal Skin warm and dry No muscular weakness  Medications Current Outpatient Prescriptions  Medication Sig Dispense Refill  . aspirin EC 81 MG tablet Take 81 mg by mouth daily.      . citalopram (CELEXA) 20 MG tablet Take 20 mg by mouth daily.      Marland Kitchen levofloxacin (LEVAQUIN) 500 MG tablet Take 1 tablet (500 mg total) by mouth daily. X 7 days  7 tablet  0  . lisinopril-hydrochlorothiazide (PRINZIDE,ZESTORETIC) 20-25 MG per tablet Take 1 tablet by mouth daily.      Marland Kitchen loratadine (CLARITIN) 10 MG tablet Take 1 tablet (10 mg total) by mouth daily.  30 tablet  2  . meclizine (ANTIVERT) 25 MG tablet Take 1 tablet (25 mg total) by mouth 2 (two) times daily.  60 tablet  2  . Omega-3 Fatty Acids (FISH OIL) 1000 MG CAPS Take 1 capsule by mouth daily.      . simvastatin (ZOCOR) 20 MG tablet Take 20 mg by mouth daily.      . Tamsulosin HCl (FLOMAX) 0.4 MG CAPS Take 0.4 mg by mouth daily.      Marland Kitchen UNABLE TO FIND Outpatient PHYSICAL THERAPY AND VESTIBULAR REHAB   Diagnosis: Benign positional vertigo  1 Mutually Defined  0   No current facility-administered medications for this visit.    Allergies Demerol and Penicillins  Family History: No family history on file.  Social History: History   Social History  . Marital Status: Married    Spouse Name:  N/A    Number of Children: N/A  . Years of Education: N/A   Occupational History  . Not on file.   Social History Main Topics  . Smoking status: Former Smoker    Quit date: 10/19/1996  . Smokeless tobacco: Never Used  . Alcohol Use: Yes     Comment: occassional  . Drug Use: No  . Sexual Activity: Not on file   Other Topics Concern  . Not on file   Social History Narrative  . No narrative on file    Electrocardiogram: 4/14 SR RBBB rate 96 01/2013   Today SR rate 67 LAD RBBB no sig change   Assessment and Plan

## 2014-06-07 NOTE — Assessment & Plan Note (Signed)
Well controlled.  Continue current medications and low sodium Dash type diet.    

## 2014-06-07 NOTE — Assessment & Plan Note (Signed)
No high risk physical features or history No history of CAD  Stable ECG and normal echo 3014  Moderate risk ortho surgery Clear to proceed 11/30 Form filled out and patient to bring to Dr Noemi Chapel

## 2014-06-07 NOTE — Assessment & Plan Note (Signed)
Resolved 1.5 years ago negative w/u No palpitatoins, chest pain  ECG stable

## 2014-06-07 NOTE — Patient Instructions (Signed)
The current medical regimen is effective;  continue present plan and medications.  You have been cleared for surgery.  Follow up as needed. 

## 2014-06-07 NOTE — Assessment & Plan Note (Signed)
Chronic stable over last year No high grade heart block

## 2014-06-14 ENCOUNTER — Encounter: Payer: Self-pay | Admitting: Podiatry

## 2014-06-14 ENCOUNTER — Ambulatory Visit (INDEPENDENT_AMBULATORY_CARE_PROVIDER_SITE_OTHER): Payer: Medicare Other | Admitting: Podiatry

## 2014-06-14 ENCOUNTER — Ambulatory Visit (INDEPENDENT_AMBULATORY_CARE_PROVIDER_SITE_OTHER): Payer: Medicare Other

## 2014-06-14 VITALS — BP 142/86 | HR 76 | Resp 12

## 2014-06-14 DIAGNOSIS — M722 Plantar fascial fibromatosis: Secondary | ICD-10-CM

## 2014-06-14 DIAGNOSIS — R52 Pain, unspecified: Secondary | ICD-10-CM

## 2014-06-14 MED ORDER — MELOXICAM 15 MG PO TABS: 15.0000 mg | ORAL_TABLET | Freq: Every day | ORAL | Status: DC

## 2014-06-14 NOTE — Progress Notes (Signed)
   Subjective:    Patient ID: Joe [name], male    DOB: 08-06-1941, 73 y.o.   MRN: [id number]  HPI  PT STATED RT BOTTOM OF THE HEEL IS BEEN HURTING FOR 3 MONTHS. THE FOOT IS GETTING WORSE. THE FOOT GET AGGRAVATED BY WALKING/PRESSURE. TRIED NO TREATMENT.  Review of Systems  Musculoskeletal: Positive for joint swelling.  Allergic/Immunologic: Positive for environmental allergies.  All other systems reviewed and are negative.      Objective:   Physical Exam: I have reviewed his past meniscal history medications allergies surgeries social history and review of systems. Pulses are strongly palpable bilateral. Neurologic sensorium is intact per Semmes-Weinstein monofilament. Deep tendon reflexes are intact bilateral muscle strength is 5 over 5 dorsiflexors plantar flexors inverters everters all intrinsic musculature is intact. Orthopedic evaluation should all joints distal to the ankle a full range of motion without crepitation. He has pain on palpation medial continued tubercle of the right heel. Radiographic evaluation confirms soft tissue increase in density at the plantar fascial calcaneal insertion site indicative of plantar fasciitis.        Assessment & Plan:  Assessment: Plantar fasciitis right foot.  Plan: Discussed etiology pathology conservative versus surgical therapies. I injected his right heel today with Kenalog and local anesthetic started him a Medrol Dosepak to be followed by meloxicam. Dispensed a night splint discussed appropriate shoe gear stretching exercises and ice therapy discussed possibly for orthotics.

## 2014-07-05 ENCOUNTER — Ambulatory Visit (INDEPENDENT_AMBULATORY_CARE_PROVIDER_SITE_OTHER): Payer: Medicare Other | Admitting: Podiatry

## 2014-07-05 ENCOUNTER — Encounter: Payer: Self-pay | Admitting: Podiatry

## 2014-07-05 DIAGNOSIS — M722 Plantar fascial fibromatosis: Secondary | ICD-10-CM

## 2014-07-05 NOTE — Progress Notes (Signed)
He presents today states that he is 100% better Z. reversed and plantar fasciitis of his right heel.  Objective: Vital signs are stable he is alert and oriented x3. Pulses are palpable there is no erythema edema cellulitis drainage or odor and there is no pain on palpation to the medial calcaneal tubercle of the right heel.  Assessment: Well-healing plantar fasciitis right foot.  Plan: Continue all conservative therapies until completely healed and then continue times one more month. We will followup with him as needed.

## 2014-07-06 ENCOUNTER — Ambulatory Visit: Payer: Medicare Other | Admitting: Cardiology

## 2014-07-10 ENCOUNTER — Telehealth: Payer: Self-pay | Admitting: Cardiology

## 2014-07-10 NOTE — Telephone Encounter (Signed)
PT  RETURNED [place]  DOB   [birth year] CALLING  RE  NEVER  RECEIVING  AMLODIPINE  SCRIPT  RESENT  TO [facility] ./CY

## 2014-07-10 NOTE — Telephone Encounter (Signed)
This is a Dr Johnsie Cancel Pt.

## 2014-07-10 NOTE — Telephone Encounter (Signed)
New problem:     Pt called to say she still has not gotten her medication Amlodipine 5 mg from [facility].   If any Qurstions please give pt a call.

## 2014-07-10 NOTE — Telephone Encounter (Signed)
LMTCB ./CY 

## 2014-09-05 ENCOUNTER — Encounter (HOSPITAL_COMMUNITY): Payer: Self-pay | Admitting: Physician Assistant

## 2014-09-05 DIAGNOSIS — M1711 Unilateral primary osteoarthritis, right knee: Secondary | ICD-10-CM | POA: Diagnosis present

## 2014-09-05 DIAGNOSIS — G4730 Sleep apnea, unspecified: Secondary | ICD-10-CM | POA: Diagnosis present

## 2014-09-05 NOTE — H&P (Signed)
TOTAL KNEE ADMISSION H&P  Patient is being admitted for right total knee arthroplasty.  Subjective:  Chief Complaint:right knee pain.  HPI: Joe [name], 73 y.o. male, has a history of pain and functional disability in the right knee due to arthritis and has failed non-surgical conservative treatments for greater than 12 weeks to includeNSAID's and/or analgesics, corticosteriod injections, viscosupplementation injections, flexibility and strengthening excercises, supervised PT with diminished ADL's post treatment, use of assistive devices, weight reduction as appropriate and activity modification.  Onset of symptoms was gradual, starting 10 years ago with gradually worsening course since that time. The patient noted prior procedures on the knee to include  arthroscopy and menisectomy on the right knee(s).  Patient currently rates pain in the right knee(s) at 10 out of 10 with activity. Patient has night pain, worsening of pain with activity and weight bearing, pain that interferes with activities of daily living, crepitus and joint swelling.  Patient has evidence of subchondral sclerosis, periarticular osteophytes and joint space narrowing by imaging studies. There is no active infection.  Patient Active Problem List   Diagnosis Date Noted  . Primary localized osteoarthritis of right knee   . Sleep apnea   . Preop cardiovascular exam 06/07/2014  . RBBB 06/07/2014  . Benign paroxysmal positional vertigo 12/13/2012  . Syncope and collapse 12/11/2012  . HTN (hypertension) 12/11/2012  . HLD (hyperlipidemia) 12/11/2012  . Depression 12/11/2012  . Hypokalemia 12/11/2012   Past Medical History  Diagnosis Date  . Hypertension   . Syncope 12/11/2012  . Depression   . Hyperlipidemia   . Sleep apnea     CPAP  . Primary localized osteoarthritis of right knee     Past Surgical History  Procedure Laterality Date  . Eye surgery Left prosthesis  . Knee surgery Bilateral   . Rotator cuff repair     . 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 Demerol [Meperidine] Nausea And Vomiting and Rash  . Penicillins Rash    History  Substance Use Topics  . Smoking status: Former Smoker    Quit date: 10/19/1996  . Smokeless tobacco: Never Used  . Alcohol Use: Yes     Comment: occassional    Family History  Problem Relation Age of Onset  . Diabetes Maternal Grandmother   . Heart attack Brother   . Heart attack Father   . Heart disease Mother   . Hypertension Mother   . Hypertension Father      Review of Systems  Constitutional: Negative.   HENT: Negative.   Eyes: Negative.   Cardiovascular: Negative.   Gastrointestinal: Negative.   Genitourinary: Negative.   Musculoskeletal: Positive for joint pain.  Skin: Negative.   Neurological: Negative.   Endo/Heme/Allergies: Negative.   Psychiatric/Behavioral: Negative.     Objective:  Physical Exam  Constitutional: He is oriented to person, place, and time. He appears well-developed and well-nourished.  HENT:  Head: Normocephalic and atraumatic.  Mouth/Throat: Oropharynx is clear and moist.  Eyes: Conjunctivae and EOM are normal. Pupils are equal, round, and reactive to light.  Neck: Neck supple.  Cardiovascular: Normal rate, regular rhythm and normal heart sounds.   Respiratory: Effort normal and breath sounds normal.  GI: Soft. Bowel sounds are normal.  Genitourinary:  Not pertinent to current symptomatology therefore not examined.  Musculoskeletal:  He has active range of motion of his right knee -5  to 120 degrees.  Medial and lateral joint line tenderness, 1+ crepitation, 1+ synovitis, normal patellar tracking.  The left knee has full range of motion, 1+ crepitus, 1+ synovitis, minimal pain today.     Neurological: He is alert and oriented to person, place, and time.  Skin: Skin is warm and dry.  Psychiatric: He has a normal mood and affect.  His behavior is normal.    Vital signs in last 24 hours: Temp:  [97.7 F (36.5 C)] 97.7 F (36.5 C) (11/18 0900) Pulse Rate:  [79] 79 (11/18 0900) BP: (128)/(80) 128/80 mmHg (11/18 0900) SpO2:  [97 %] 97 % (11/18 0900) Weight:  [119.296 kg (263 lb)] 119.296 kg (263 lb) (11/18 0900)  Labs:   Estimated body mass index is 41.18 kg/(m^2) as calculated from the following:   Height as of this encounter: 5\' 7"  (1.702 m).   Weight as of this encounter: 119.296 kg (263 lb).   Imaging Review Plain radiographs demonstrate severe degenerative joint disease of the right knee(s). The overall alignment issignificant varus. The bone quality appears to be good for age and reported activity level.  Assessment/Plan:  End stage arthritis, right knee  Active Problems:   HTN (hypertension)   Depression   RBBB   Primary localized osteoarthritis of right knee   Sleep apnea   The patient history, physical examination, clinical judgment of the provider and imaging studies are consistent with end stage degenerative joint disease of the right knee(s) and total knee arthroplasty is deemed medically necessary. The treatment options including medical management, injection therapy arthroscopy and arthroplasty were discussed at length. The risks and benefits of total knee arthroplasty were presented and reviewed. The risks due to aseptic loosening, infection, stiffness, patella tracking problems, thromboembolic complications and other imponderables were discussed. The patient acknowledged the explanation, agreed to proceed with the plan and consent was signed. Patient is being admitted for inpatient treatment for surgery, pain control, PT, OT, prophylactic antibiotics, VTE prophylaxis, progressive ambulation and ADL's and discharge planning. The patient is planning to be discharged home with home health services   Ellenora Talton A. Kaleen Mask Physician Assistant [facility] [phone]  09/05/2014, 10:10 AM

## 2014-09-07 ENCOUNTER — Other Ambulatory Visit (HOSPITAL_COMMUNITY): Payer: Medicare Other

## 2014-09-08 NOTE — Pre-Procedure Instructions (Signed)
BUTLER VEGH  09/08/2014   Your procedure is scheduled on:  November 30  Report to [facility] Admitting at 07:00 AM.  Call this number if you have problems the morning of surgery: [phone]   Remember:   Do not eat food or drink liquids after midnight.   Take these medicines the morning of surgery with A SIP OF WATER: Amlodipine, Celexa, Flomax   STOP Aspirin, Fish Oil, Meloxicam today    STOP/ Do not take Aspirin, Aleve, Naproxen, Advil, Ibuprofen, Motrin, Vitamins, Herbs, or Supplements starting today   Do not wear jewelry, make-up or nail polish.  Do not wear lotions, powders, or perfumes. You may wear deodorant.  Do not shave 48 hours prior to surgery. Men may shave face and neck.  Do not bring valuables to the hospital.  [facility] is not responsible for any belongings or valuables.               Contacts, dentures or bridgework may not be worn into surgery.  Leave suitcase in the car. After surgery it may be brought to your room.  For patients admitted to the hospital, discharge time is determined by your treatment team.               Special Instructions: [place] - Preparing for Surgery  Before surgery, you can play an important role.  Because skin is not sterile, your skin needs to be as free of germs as possible.  You can reduce the number of germs on you skin by washing with CHG (chlorahexidine gluconate) soap before surgery.  CHG is an antiseptic cleaner which kills germs and bonds with the skin to continue killing germs even after washing.  Please DO NOT use if you have an allergy to CHG or antibacterial soaps.  If your skin becomes reddened/irritated stop using the CHG and inform your nurse when you arrive at Short Stay.  Do not shave (including legs and underarms) for at least 48 hours prior to the first CHG shower.  You may shave your face.  Please follow these instructions carefully:   1.  Shower with CHG Soap the night before surgery and the  morning of Surgery.  2.  If you choose to wash your hair, wash your hair first as usual with your normal shampoo.  3.  After you shampoo, rinse your hair and body thoroughly to remove the shampoo.  4.  Use CHG as you would any other liquid soap.  You can apply CHG directly to the skin and wash gently with scrungie or a clean washcloth.  5.  Apply the CHG Soap to your body ONLY FROM THE NECK DOWN.  Do not use on open wounds or open sores.  Avoid contact with your eyes, ears, mouth and genitals (private parts).  Wash genitals (private parts) with your normal soap.  6.  Wash thoroughly, paying special attention to the area where your surgery will be performed.  7.  Thoroughly rinse your body with warm water from the neck down.  8.  DO NOT shower/wash with your normal soap after using and rinsing off the CHG Soap.  9.  Pat yourself dry with a clean towel.            10.  Wear clean pajamas.            11.  Place clean sheets on your bed the night of your first shower and do not sleep with pets.  Day  of Surgery  Do not apply any lotions the morning of surgery.  Please wear clean clothes to the hospital/surgery center.     Please read over the following fact sheets that you were given: Pain Booklet, Coughing and Deep Breathing, Blood Transfusion Information and Total Joint Packet

## 2014-09-10 ENCOUNTER — Encounter (HOSPITAL_COMMUNITY)
Admission: RE | Admit: 2014-09-10 | Discharge: 2014-09-10 | Disposition: A | Discharge status: Home / Self Care | Payer: Medicare Other | Source: Ambulatory Visit | Attending: Orthopedic Surgery | Admitting: Orthopedic Surgery

## 2014-09-10 ENCOUNTER — Encounter (HOSPITAL_COMMUNITY): Payer: Self-pay

## 2014-09-10 DIAGNOSIS — E785 Hyperlipidemia, unspecified: Secondary | ICD-10-CM | POA: Insufficient documentation

## 2014-09-10 DIAGNOSIS — G4733 Obstructive sleep apnea (adult) (pediatric): Secondary | ICD-10-CM | POA: Diagnosis not present

## 2014-09-10 DIAGNOSIS — I517 Cardiomegaly: Secondary | ICD-10-CM | POA: Diagnosis not present

## 2014-09-10 DIAGNOSIS — Z87891 Personal history of nicotine dependence: Secondary | ICD-10-CM | POA: Diagnosis not present

## 2014-09-10 DIAGNOSIS — R0602 Shortness of breath: Secondary | ICD-10-CM

## 2014-09-10 DIAGNOSIS — Z6841 Body Mass Index (BMI) 40.0 and over, adult: Secondary | ICD-10-CM | POA: Diagnosis not present

## 2014-09-10 DIAGNOSIS — I10 Essential (primary) hypertension: Secondary | ICD-10-CM | POA: Diagnosis not present

## 2014-09-10 DIAGNOSIS — R55 Syncope and collapse: Secondary | ICD-10-CM | POA: Insufficient documentation

## 2014-09-10 DIAGNOSIS — I4519 Other right bundle-branch block: Secondary | ICD-10-CM | POA: Diagnosis not present

## 2014-09-10 DIAGNOSIS — Z01818 Encounter for other preprocedural examination: Secondary | ICD-10-CM | POA: Diagnosis not present

## 2014-09-10 HISTORY — DX: Reserved for inherently not codable concepts without codable children: IMO0001

## 2014-09-10 LAB — COMPREHENSIVE METABOLIC PANEL
ALT: 25 U/L (ref 0–53)
ANION GAP: 10 (ref 5–15)
AST: 21 U/L (ref 0–37)
Albumin: 3.6 g/dL (ref 3.5–5.2)
Alkaline Phosphatase: 81 U/L (ref 39–117)
BILIRUBIN TOTAL: 0.6 mg/dL (ref 0.3–1.2)
BUN: 12 mg/dL (ref 6–23)
CHLORIDE: 102 meq/L (ref 96–112)
CO2: 28 mEq/L (ref 19–32)
Calcium: 9.5 mg/dL (ref 8.4–10.5)
Creatinine, Ser: 0.83 mg/dL (ref 0.50–1.35)
GFR, EST NON AFRICAN AMERICAN: 86 mL/min — AB (ref >90)
Glucose, Bld: 103 mg/dL — ABNORMAL HIGH (ref 70–99)
Potassium: 4.4 mEq/L (ref 3.7–5.3)
Sodium: 140 mEq/L (ref 137–147)
Total Protein: 7 g/dL (ref 6.0–8.3)

## 2014-09-10 LAB — URINALYSIS, ROUTINE W REFLEX MICROSCOPIC
BILIRUBIN URINE: NEGATIVE
Glucose, UA: NEGATIVE mg/dL
HGB URINE DIPSTICK: NEGATIVE
Ketones, ur: NEGATIVE mg/dL
Leukocytes, UA: NEGATIVE
Nitrite: NEGATIVE
Protein, ur: NEGATIVE mg/dL
SPECIFIC GRAVITY, URINE: 1.02 (ref 1.005–1.030)
Urobilinogen, UA: 0.2 mg/dL (ref 0.0–1.0)
pH: 5 (ref 5.0–8.0)

## 2014-09-10 LAB — SURGICAL PCR SCREEN
MRSA, PCR: NEGATIVE
Staphylococcus aureus: NEGATIVE

## 2014-09-10 LAB — PROTIME-INR
INR: 1.07 (ref 0.00–1.49)
PROTHROMBIN TIME: 14.1 s (ref 11.6–15.2)

## 2014-09-10 LAB — CBC WITH DIFFERENTIAL/PLATELET
Basophils Absolute: 0 10*3/uL (ref 0.0–0.1)
Basophils Relative: 1 % (ref 0–1)
Eosinophils Absolute: 0.3 10*3/uL (ref 0.0–0.7)
Eosinophils Relative: 3 % (ref 0–5)
HEMATOCRIT: 41.2 % (ref 39.0–52.0)
HEMOGLOBIN: 13.2 g/dL (ref 13.0–17.0)
Lymphocytes Relative: 13 % (ref 12–46)
Lymphs Abs: 1 10*3/uL (ref 0.7–4.0)
MCH: 26.3 pg (ref 26.0–34.0)
MCHC: 32 g/dL (ref 30.0–36.0)
MCV: 82.1 fL (ref 78.0–100.0)
MONO ABS: 0.9 10*3/uL (ref 0.1–1.0)
MONOS PCT: 11 % (ref 3–12)
NEUTROS ABS: 5.9 10*3/uL (ref 1.7–7.7)
Neutrophils Relative %: 72 % (ref 43–77)
Platelets: 208 10*3/uL (ref 150–400)
RBC: 5.02 MIL/uL (ref 4.22–5.81)
RDW: 13.8 % (ref 11.5–15.5)
WBC: 8.1 10*3/uL (ref 4.0–10.5)

## 2014-09-10 LAB — TYPE AND SCREEN
ABO/RH(D): O POS
Antibody Screen: NEGATIVE

## 2014-09-10 LAB — APTT: aPTT: 31 seconds (ref 24–37)

## 2014-09-10 LAB — ABO/RH: ABO/RH(D): O POS

## 2014-09-10 MED ORDER — LACTATED RINGERS IV SOLN: INTRAVENOUS | Status: DC

## 2014-09-10 NOTE — Progress Notes (Signed)
Anesthesia Chart Review:  Pt is 72 year old male scheduled for R total knee arthroplasty on 09/17/2014 with Dr. Noemi Chapel.   PMH: HTN, hyperlipidemia, OSA, syncope 11/2012 with negative cardiac work up. Former smoker. BMI 41.59  Medications include: ASA, amlodipine, lisinopril/hctz, simvastatin, flomax  Preoperative labs reviewed.    Chest x-ray reviewed. No active cardiopulmonary disease.   EKG 06/07/2014: NSR. L axis deviation. Incomplete RBBB.   2D echo 12/12/2012: - Left ventricle: The cavity size was normal. There was mild concentric hypertrophy. Systolic function was normal. The estimated ejection fraction was in the range of 60% to 65%. Wall motion was normal; there were no regional wall motion abnormalities. - Left atrium: The atrium was mildly dilated. - Pulmonary arteries: Systolic pressure was mildly increased. PA peak pressure: 66mm Hg (S).  Pt has cardiac clearance from Dr. Johnsie Cancel in [place] note dated 06/07/2014. Moderate risk for ortho surgery.   If no changes, I anticipate pt can proceed with surgery as scheduled.   Willeen Cass, FNP-BC [facility] Short Stay Surgical Center/Anesthesiology Phone: [phone] 09/10/2014 3:39 PM

## 2014-09-12 LAB — URINE CULTURE
Colony Count: NO GROWTH
Culture: NO GROWTH

## 2014-09-16 MED ORDER — VANCOMYCIN HCL 10 G IV SOLR
1500.0000 mg | INTRAVENOUS | Status: AC
  Administered 2014-09-17: 1500 mg via INTRAVENOUS
  Filled 2014-09-16: qty 1500

## 2014-09-17 ENCOUNTER — Inpatient Hospital Stay (HOSPITAL_COMMUNITY)
Admission: RE | Admit: 2014-09-17 | Discharge: 2014-09-18 | DRG: 470 | Disposition: A | Discharge status: Home / Self Care | Payer: Medicare Other | Source: Ambulatory Visit | Attending: Orthopedic Surgery | Admitting: Orthopedic Surgery

## 2014-09-17 ENCOUNTER — Inpatient Hospital Stay (HOSPITAL_COMMUNITY): Payer: Medicare Other | Admitting: Certified Registered Nurse Anesthetist

## 2014-09-17 ENCOUNTER — Encounter (HOSPITAL_COMMUNITY): Payer: Self-pay | Admitting: *Deleted

## 2014-09-17 ENCOUNTER — Inpatient Hospital Stay (HOSPITAL_COMMUNITY): Payer: Medicare Other | Admitting: Emergency Medicine

## 2014-09-17 ENCOUNTER — Encounter (HOSPITAL_COMMUNITY)
Admission: RE | Disposition: A | Discharge status: Home / Self Care | Payer: Self-pay | Source: Ambulatory Visit | Attending: Orthopedic Surgery

## 2014-09-17 DIAGNOSIS — G4730 Sleep apnea, unspecified: Secondary | ICD-10-CM | POA: Diagnosis present

## 2014-09-17 DIAGNOSIS — F32A Depression, unspecified: Secondary | ICD-10-CM | POA: Diagnosis present

## 2014-09-17 DIAGNOSIS — M1711 Unilateral primary osteoarthritis, right knee: Principal | ICD-10-CM | POA: Diagnosis present

## 2014-09-17 DIAGNOSIS — I4510 Unspecified right bundle-branch block: Secondary | ICD-10-CM | POA: Diagnosis present

## 2014-09-17 DIAGNOSIS — I10 Essential (primary) hypertension: Secondary | ICD-10-CM | POA: Diagnosis present

## 2014-09-17 DIAGNOSIS — M1710 Unilateral primary osteoarthritis, unspecified knee: Secondary | ICD-10-CM | POA: Diagnosis present

## 2014-09-17 DIAGNOSIS — F329 Major depressive disorder, single episode, unspecified: Secondary | ICD-10-CM | POA: Diagnosis present

## 2014-09-17 DIAGNOSIS — M25561 Pain in right knee: Secondary | ICD-10-CM | POA: Diagnosis present

## 2014-09-17 DIAGNOSIS — Z6841 Body Mass Index (BMI) 40.0 and over, adult: Secondary | ICD-10-CM

## 2014-09-17 DIAGNOSIS — M179 Osteoarthritis of knee, unspecified: Secondary | ICD-10-CM | POA: Diagnosis present

## 2014-09-17 DIAGNOSIS — Z880 Allergy status to penicillin: Secondary | ICD-10-CM | POA: Diagnosis not present

## 2014-09-17 DIAGNOSIS — Z87891 Personal history of nicotine dependence: Secondary | ICD-10-CM

## 2014-09-17 DIAGNOSIS — E785 Hyperlipidemia, unspecified: Secondary | ICD-10-CM | POA: Diagnosis present

## 2014-09-17 DIAGNOSIS — Z888 Allergy status to other drugs, medicaments and biological substances status: Secondary | ICD-10-CM

## 2014-09-17 HISTORY — PX: TOTAL KNEE ARTHROPLASTY: SHX125

## 2014-09-17 HISTORY — DX: Unilateral primary osteoarthritis, right knee: M17.11

## 2014-09-17 SURGERY — ARTHROPLASTY, KNEE, TOTAL
Anesthesia: Regional | Site: Knee | Laterality: Right

## 2014-09-17 MED ORDER — ACETAMINOPHEN 500 MG PO TABS
1000.0000 mg | ORAL_TABLET | Freq: Four times a day (QID) | ORAL | Status: AC
  Administered 2014-09-17 (×3): 1000 mg via ORAL
  Filled 2014-09-17 (×2): qty 2

## 2014-09-17 MED ORDER — POVIDONE-IODINE 7.5 % EX SOLN: Freq: Once | CUTANEOUS | Status: DC

## 2014-09-17 MED ORDER — APIXABAN 2.5 MG PO TABS
2.5000 mg | ORAL_TABLET | Freq: Two times a day (BID) | ORAL | Status: DC
  Filled 2014-09-17: qty 1

## 2014-09-17 MED ORDER — PROPOFOL 10 MG/ML IV BOLUS
INTRAVENOUS | Status: AC
  Filled 2014-09-17: qty 20

## 2014-09-17 MED ORDER — MIDAZOLAM HCL 2 MG/2ML IJ SOLN
INTRAMUSCULAR | Status: AC
  Administered 2014-09-17: 1 mg via INTRAVENOUS
  Filled 2014-09-17: qty 2

## 2014-09-17 MED ORDER — BUPIVACAINE HCL 0.25 % IJ SOLN
INTRAMUSCULAR | Status: DC | PRN
  Administered 2014-09-17: 4 mL

## 2014-09-17 MED ORDER — PROPOFOL 10 MG/ML IV BOLUS
INTRAVENOUS | Status: DC | PRN
  Administered 2014-09-17: 50 mg via INTRAVENOUS
  Administered 2014-09-17: 200 mg via INTRAVENOUS
  Administered 2014-09-17: 20 mg via INTRAVENOUS
  Administered 2014-09-17 (×2): 50 mg via INTRAVENOUS
  Administered 2014-09-17: 20 mg via INTRAVENOUS

## 2014-09-17 MED ORDER — LISINOPRIL-HYDROCHLOROTHIAZIDE 20-12.5 MG PO TABS: 2.0000 | ORAL_TABLET | Freq: Every day | ORAL | Status: DC

## 2014-09-17 MED ORDER — GLYCOPYRROLATE 0.2 MG/ML IJ SOLN
INTRAMUSCULAR | Status: AC
  Filled 2014-09-17: qty 3

## 2014-09-17 MED ORDER — BUPIVACAINE-EPINEPHRINE (PF) 0.25% -1:200000 IJ SOLN
INTRAMUSCULAR | Status: AC
  Filled 2014-09-17: qty 30

## 2014-09-17 MED ORDER — CHLORHEXIDINE GLUCONATE 4 % EX LIQD: 60.0000 mL | Freq: Once | CUTANEOUS | Status: DC

## 2014-09-17 MED ORDER — MENTHOL 3 MG MT LOZG: 1.0000 | LOZENGE | OROMUCOSAL | Status: DC | PRN

## 2014-09-17 MED ORDER — DEXAMETHASONE SODIUM PHOSPHATE 4 MG/ML IJ SOLN
INTRAMUSCULAR | Status: AC
  Filled 2014-09-17: qty 1

## 2014-09-17 MED ORDER — GLYCOPYRROLATE 0.2 MG/ML IJ SOLN
INTRAMUSCULAR | Status: DC | PRN
  Administered 2014-09-17: 0.6 mg via INTRAVENOUS

## 2014-09-17 MED ORDER — NEOSTIGMINE METHYLSULFATE 10 MG/10ML IV SOLN
INTRAVENOUS | Status: AC
  Filled 2014-09-17: qty 2

## 2014-09-17 MED ORDER — SODIUM CHLORIDE 0.9 % IR SOLN
Status: DC | PRN
  Administered 2014-09-17: 3000 mL

## 2014-09-17 MED ORDER — FENTANYL CITRATE 0.05 MG/ML IJ SOLN
INTRAMUSCULAR | Status: AC
  Filled 2014-09-17: qty 5

## 2014-09-17 MED ORDER — HYDROMORPHONE HCL 1 MG/ML IJ SOLN
0.2500 mg | INTRAMUSCULAR | Status: DC | PRN
  Administered 2014-09-17 (×4): 0.5 mg via INTRAVENOUS

## 2014-09-17 MED ORDER — BUPIVACAINE-EPINEPHRINE 0.25% -1:200000 IJ SOLN
INTRAMUSCULAR | Status: DC | PRN
  Administered 2014-09-17: 30 mL

## 2014-09-17 MED ORDER — EPHEDRINE SULFATE 50 MG/ML IJ SOLN
INTRAMUSCULAR | Status: AC
  Filled 2014-09-17: qty 1

## 2014-09-17 MED ORDER — VANCOMYCIN HCL IN DEXTROSE 1-5 GM/200ML-% IV SOLN
1000.0000 mg | Freq: Two times a day (BID) | INTRAVENOUS | Status: AC
  Administered 2014-09-17: 1000 mg via INTRAVENOUS
  Filled 2014-09-17: qty 200

## 2014-09-17 MED ORDER — PROMETHAZINE HCL 25 MG/ML IJ SOLN: 6.2500 mg | INTRAMUSCULAR | Status: DC | PRN

## 2014-09-17 MED ORDER — SIMVASTATIN 20 MG PO TABS
20.0000 mg | ORAL_TABLET | Freq: Every day | ORAL | Status: DC
  Administered 2014-09-17: 20 mg via ORAL
  Filled 2014-09-17 (×2): qty 1

## 2014-09-17 MED ORDER — FENTANYL CITRATE 0.05 MG/ML IJ SOLN
INTRAMUSCULAR | Status: AC
  Administered 2014-09-17: 50 ug via INTRAVENOUS
  Filled 2014-09-17: qty 2

## 2014-09-17 MED ORDER — METOCLOPRAMIDE HCL 5 MG/ML IJ SOLN: 5.0000 mg | Freq: Three times a day (TID) | INTRAMUSCULAR | Status: DC | PRN

## 2014-09-17 MED ORDER — ALUM & MAG HYDROXIDE-SIMETH 200-200-20 MG/5ML PO SUSP: 30.0000 mL | ORAL | Status: DC | PRN

## 2014-09-17 MED ORDER — DOCUSATE SODIUM 100 MG PO CAPS
100.0000 mg | ORAL_CAPSULE | Freq: Two times a day (BID) | ORAL | Status: DC
  Administered 2014-09-17 – 2014-09-18 (×2): 100 mg via ORAL
  Filled 2014-09-17 (×3): qty 1

## 2014-09-17 MED ORDER — FENTANYL CITRATE 0.05 MG/ML IJ SOLN
INTRAMUSCULAR | Status: DC | PRN
  Administered 2014-09-17: 100 ug via INTRAVENOUS
  Administered 2014-09-17 (×3): 50 ug via INTRAVENOUS

## 2014-09-17 MED ORDER — HYDROCHLOROTHIAZIDE 25 MG PO TABS
25.0000 mg | ORAL_TABLET | Freq: Every day | ORAL | Status: DC
  Administered 2014-09-17 – 2014-09-18 (×2): 25 mg via ORAL
  Filled 2014-09-17 (×2): qty 1

## 2014-09-17 MED ORDER — ROCURONIUM BROMIDE 50 MG/5ML IV SOLN
INTRAVENOUS | Status: AC
  Filled 2014-09-17: qty 1

## 2014-09-17 MED ORDER — ONDANSETRON HCL 4 MG/2ML IJ SOLN
4.0000 mg | Freq: Four times a day (QID) | INTRAMUSCULAR | Status: DC | PRN
Start: 2014-09-17 — End: 2014-09-18

## 2014-09-17 MED ORDER — EPHEDRINE SULFATE 50 MG/ML IJ SOLN
INTRAMUSCULAR | Status: DC | PRN
  Administered 2014-09-17: 5 mg via INTRAVENOUS
  Administered 2014-09-17: 10 mg via INTRAVENOUS
  Administered 2014-09-17 (×5): 5 mg via INTRAVENOUS

## 2014-09-17 MED ORDER — BUPIVACAINE HCL (PF) 0.25 % IJ SOLN
INTRAMUSCULAR | Status: AC
  Filled 2014-09-17: qty 30

## 2014-09-17 MED ORDER — OXYCODONE HCL 5 MG/5ML PO SOLN: 5.0000 mg | Freq: Once | ORAL | Status: DC | PRN

## 2014-09-17 MED ORDER — ONDANSETRON HCL 4 MG/2ML IJ SOLN
INTRAMUSCULAR | Status: AC
  Filled 2014-09-17: qty 2

## 2014-09-17 MED ORDER — ACETAMINOPHEN 10 MG/ML IV SOLN
INTRAVENOUS | Status: DC | PRN
  Administered 2014-09-17: 1000 mg via INTRAVENOUS

## 2014-09-17 MED ORDER — SUCCINYLCHOLINE CHLORIDE 20 MG/ML IJ SOLN
INTRAMUSCULAR | Status: DC | PRN
  Administered 2014-09-17: 100 mg via INTRAVENOUS

## 2014-09-17 MED ORDER — ONDANSETRON HCL 4 MG/2ML IJ SOLN
INTRAMUSCULAR | Status: DC | PRN
  Administered 2014-09-17: 4 mg via INTRAVENOUS

## 2014-09-17 MED ORDER — 0.9 % SODIUM CHLORIDE (POUR BTL) OPTIME
TOPICAL | Status: DC | PRN
  Administered 2014-09-17: 1000 mL

## 2014-09-17 MED ORDER — DIPHENHYDRAMINE HCL 12.5 MG/5ML PO ELIX: 12.5000 mg | ORAL_SOLUTION | ORAL | Status: DC | PRN

## 2014-09-17 MED ORDER — HYDROMORPHONE HCL 1 MG/ML IJ SOLN: 1.0000 mg | INTRAMUSCULAR | Status: DC | PRN

## 2014-09-17 MED ORDER — PHENOL 1.4 % MT LIQD: 1.0000 | OROMUCOSAL | Status: DC | PRN

## 2014-09-17 MED ORDER — METOCLOPRAMIDE HCL 10 MG PO TABS
5.0000 mg | ORAL_TABLET | Freq: Three times a day (TID) | ORAL | Status: DC | PRN
Start: 2014-09-17 — End: 2014-09-18

## 2014-09-17 MED ORDER — ACETAMINOPHEN 10 MG/ML IV SOLN
INTRAVENOUS | Status: AC
  Filled 2014-09-17: qty 100

## 2014-09-17 MED ORDER — OXYCODONE HCL ER 20 MG PO T12A
20.0000 mg | EXTENDED_RELEASE_TABLET | Freq: Two times a day (BID) | ORAL | Status: DC
  Administered 2014-09-17 – 2014-09-18 (×2): 20 mg via ORAL
  Filled 2014-09-17 (×2): qty 2

## 2014-09-17 MED ORDER — TAMSULOSIN HCL 0.4 MG PO CAPS
0.4000 mg | ORAL_CAPSULE | Freq: Every day | ORAL | Status: DC
  Administered 2014-09-18: 0.4 mg via ORAL
  Filled 2014-09-17 (×2): qty 1

## 2014-09-17 MED ORDER — STERILE WATER FOR INJECTION IJ SOLN
INTRAMUSCULAR | Status: AC
  Filled 2014-09-17: qty 10

## 2014-09-17 MED ORDER — HYDROMORPHONE HCL 1 MG/ML IJ SOLN
INTRAMUSCULAR | Status: AC
  Filled 2014-09-17: qty 1

## 2014-09-17 MED ORDER — OXYCODONE HCL 5 MG PO TABS: 5.0000 mg | ORAL_TABLET | Freq: Once | ORAL | Status: DC | PRN

## 2014-09-17 MED ORDER — HYDROMORPHONE HCL 1 MG/ML IJ SOLN
INTRAMUSCULAR | Status: AC
Start: 2014-09-17 — End: 2014-09-17
  Filled 2014-09-17: qty 1

## 2014-09-17 MED ORDER — ONDANSETRON HCL 4 MG PO TABS: 4.0000 mg | ORAL_TABLET | Freq: Four times a day (QID) | ORAL | Status: DC | PRN

## 2014-09-17 MED ORDER — POLYETHYLENE GLYCOL 3350 17 G PO PACK
17.0000 g | PACK | Freq: Two times a day (BID) | ORAL | Status: DC
  Administered 2014-09-17 – 2014-09-18 (×2): 17 g via ORAL
  Filled 2014-09-17 (×4): qty 1

## 2014-09-17 MED ORDER — OXYCODONE HCL 5 MG PO TABS
5.0000 mg | ORAL_TABLET | ORAL | Status: DC | PRN
  Administered 2014-09-17 – 2014-09-18 (×5): 10 mg via ORAL
  Filled 2014-09-17 (×4): qty 2

## 2014-09-17 MED ORDER — LACTATED RINGERS IV SOLN
INTRAVENOUS | Status: DC | PRN
  Administered 2014-09-17 (×2): via INTRAVENOUS

## 2014-09-17 MED ORDER — NEOSTIGMINE METHYLSULFATE 10 MG/10ML IV SOLN
INTRAVENOUS | Status: DC | PRN
  Administered 2014-09-17: 4 mg via INTRAVENOUS

## 2014-09-17 MED ORDER — ACETAMINOPHEN 325 MG PO TABS: 650.0000 mg | ORAL_TABLET | Freq: Four times a day (QID) | ORAL | Status: DC | PRN

## 2014-09-17 MED ORDER — LIDOCAINE HCL (CARDIAC) 20 MG/ML IV SOLN
INTRAVENOUS | Status: AC
Start: 2014-09-17 — End: 2014-09-17
  Filled 2014-09-17: qty 5

## 2014-09-17 MED ORDER — OXYCODONE HCL 5 MG PO TABS
ORAL_TABLET | ORAL | Status: AC
  Filled 2014-09-17: qty 2

## 2014-09-17 MED ORDER — ACETAMINOPHEN 650 MG RE SUPP: 650.0000 mg | Freq: Four times a day (QID) | RECTAL | Status: DC | PRN

## 2014-09-17 MED ORDER — AMLODIPINE BESYLATE 5 MG PO TABS
5.0000 mg | ORAL_TABLET | Freq: Every day | ORAL | Status: DC
  Administered 2014-09-18: 5 mg via ORAL
  Filled 2014-09-17: qty 1

## 2014-09-17 MED ORDER — LACTATED RINGERS IV SOLN
INTRAVENOUS | Status: DC
  Administered 2014-09-17: 08:00:00 via INTRAVENOUS

## 2014-09-17 MED ORDER — BUPIVACAINE-EPINEPHRINE (PF) 0.5% -1:200000 IJ SOLN
INTRAMUSCULAR | Status: DC | PRN
  Administered 2014-09-17: 30 mL via PERINEURAL

## 2014-09-17 MED ORDER — ROCURONIUM BROMIDE 100 MG/10ML IV SOLN
INTRAVENOUS | Status: DC | PRN
  Administered 2014-09-17 (×2): 20 mg via INTRAVENOUS

## 2014-09-17 MED ORDER — CITALOPRAM HYDROBROMIDE 20 MG PO TABS
20.0000 mg | ORAL_TABLET | Freq: Every day | ORAL | Status: DC
  Administered 2014-09-18: 20 mg via ORAL
  Filled 2014-09-17: qty 1

## 2014-09-17 MED ORDER — LISINOPRIL 40 MG PO TABS
40.0000 mg | ORAL_TABLET | Freq: Every day | ORAL | Status: DC
  Administered 2014-09-17 – 2014-09-18 (×2): 40 mg via ORAL
  Filled 2014-09-17 (×2): qty 1

## 2014-09-17 MED ORDER — DEXAMETHASONE SODIUM PHOSPHATE 10 MG/ML IJ SOLN
10.0000 mg | Freq: Three times a day (TID) | INTRAMUSCULAR | Status: DC
  Administered 2014-09-17 – 2014-09-18 (×2): 10 mg via INTRAVENOUS
  Filled 2014-09-17 (×8): qty 1

## 2014-09-17 MED ORDER — METHYLPREDNISOLONE ACETATE 80 MG/ML IJ SUSP
INTRAMUSCULAR | Status: DC | PRN
  Administered 2014-09-17: 80 mg

## 2014-09-17 MED ORDER — CELECOXIB 200 MG PO CAPS
200.0000 mg | ORAL_CAPSULE | Freq: Two times a day (BID) | ORAL | Status: DC
  Administered 2014-09-17 – 2014-09-18 (×3): 200 mg via ORAL
  Filled 2014-09-17 (×4): qty 1

## 2014-09-17 MED ORDER — METHYLPREDNISOLONE ACETATE 80 MG/ML IJ SUSP
INTRAMUSCULAR | Status: AC
  Filled 2014-09-17: qty 1

## 2014-09-17 MED ORDER — DEXAMETHASONE SODIUM PHOSPHATE 4 MG/ML IJ SOLN
INTRAMUSCULAR | Status: DC | PRN
  Administered 2014-09-17 (×2): 4 mg via INTRAVENOUS

## 2014-09-17 MED ORDER — POTASSIUM CHLORIDE IN NACL 20-0.9 MEQ/L-% IV SOLN
INTRAVENOUS | Status: DC
  Administered 2014-09-17: 14:00:00 via INTRAVENOUS
  Filled 2014-09-17 (×5): qty 1000

## 2014-09-17 SURGICAL SUPPLY — 72 items
APL SKNCLS STERI-STRIP NONHPOA (GAUZE/BANDAGES/DRESSINGS) ×1
BANDAGE ELASTIC 6 VELCRO ST LF (GAUZE/BANDAGES/DRESSINGS) ×1 IMPLANT
BANDAGE ESMARK 6X9 LF (GAUZE/BANDAGES/DRESSINGS) ×1 IMPLANT
BENZOIN TINCTURE PRP APPL 2/3 (GAUZE/BANDAGES/DRESSINGS) ×2 IMPLANT
BLADE SAGITTAL 25.0X1.19X90 (BLADE) ×2 IMPLANT
BLADE SAW SGTL 13.0X1.19X90.0M (BLADE) ×2 IMPLANT
BLADE SURG 10 STRL SS (BLADE) ×4 IMPLANT
BNDG CMPR 9X6 STRL LF SNTH (GAUZE/BANDAGES/DRESSINGS) ×1
BNDG CMPR MED 15X6 ELC VLCR LF (GAUZE/BANDAGES/DRESSINGS) ×1
BNDG ELASTIC 6X15 VLCR STRL LF (GAUZE/BANDAGES/DRESSINGS) ×2 IMPLANT
BNDG ESMARK 6X9 LF (GAUZE/BANDAGES/DRESSINGS) ×2
BOWL SMART MIX CTS (DISPOSABLE) ×2 IMPLANT
CAPT RP KNEE ×1 IMPLANT
CEMENT HV SMART SET (Cement) ×4 IMPLANT
CLSR STERI-STRIP ANTIMIC 1/2X4 (GAUZE/BANDAGES/DRESSINGS) ×1 IMPLANT
COVER SURGICAL LIGHT HANDLE (MISCELLANEOUS) ×2 IMPLANT
CUFF TOURNIQUET SINGLE 34IN LL (TOURNIQUET CUFF) ×2 IMPLANT
DRAPE EXTREMITY T 121X128X90 (DRAPE) ×2 IMPLANT
DRAPE IMP U-DRAPE 54X76 (DRAPES) ×2 IMPLANT
DRAPE INCISE IOBAN 66X45 STRL (DRAPES) ×2 IMPLANT
DRAPE PROXIMA HALF (DRAPES) ×2 IMPLANT
DRAPE U-SHAPE 47X51 STRL (DRAPES) ×2 IMPLANT
DRSG AQUACEL AG ADV 3.5X10 (GAUZE/BANDAGES/DRESSINGS) ×1 IMPLANT
DRSG AQUACEL AG ADV 3.5X14 (GAUZE/BANDAGES/DRESSINGS) ×2 IMPLANT
DRSG PAD ABDOMINAL 8X10 ST (GAUZE/BANDAGES/DRESSINGS) ×3 IMPLANT
DURAPREP 26ML APPLICATOR (WOUND CARE) ×4 IMPLANT
ELECT CAUTERY BLADE 6.4 (BLADE) ×2 IMPLANT
ELECT REM PT RETURN 9FT ADLT (ELECTROSURGICAL) ×2
ELECTRODE REM PT RTRN 9FT ADLT (ELECTROSURGICAL) ×1 IMPLANT
EVACUATOR 1/8 PVC DRAIN (DRAIN) ×2 IMPLANT
FACESHIELD WRAPAROUND (MASK) ×2 IMPLANT
FACESHIELD WRAPAROUND OR TEAM (MASK) ×1 IMPLANT
GAUZE SPONGE 4X4 12PLY STRL (GAUZE/BANDAGES/DRESSINGS) ×2 IMPLANT
GLOVE BIO SURGEON STRL SZ7 (GLOVE) ×5 IMPLANT
GLOVE BIOGEL PI IND STRL 7.0 (GLOVE) ×1 IMPLANT
GLOVE BIOGEL PI IND STRL 7.5 (GLOVE) ×1 IMPLANT
GLOVE BIOGEL PI INDICATOR 7.0 (GLOVE) ×4
GLOVE BIOGEL PI INDICATOR 7.5 (GLOVE) ×1
GLOVE SS BIOGEL STRL SZ 7.5 (GLOVE) ×1 IMPLANT
GLOVE SUPERSENSE BIOGEL SZ 7.5 (GLOVE) ×1
GOWN STRL REUS W/ TWL LRG LVL3 (GOWN DISPOSABLE) ×2 IMPLANT
GOWN STRL REUS W/ TWL XL LVL3 (GOWN DISPOSABLE) ×2 IMPLANT
GOWN STRL REUS W/TWL LRG LVL3 (GOWN DISPOSABLE) ×4
GOWN STRL REUS W/TWL XL LVL3 (GOWN DISPOSABLE) ×4
HANDPIECE INTERPULSE COAX TIP (DISPOSABLE) ×2
HOOD PEEL AWAY FACE SHEILD DIS (HOOD) ×4 IMPLANT
IMMOBILIZER KNEE 22 UNIV (SOFTGOODS) ×1 IMPLANT
KIT BASIN OR (CUSTOM PROCEDURE TRAY) ×2 IMPLANT
KIT ROOM TURNOVER OR (KITS) ×2 IMPLANT
MANIFOLD NEPTUNE II (INSTRUMENTS) ×2 IMPLANT
MARKER SKIN DUAL TIP RULER LAB (MISCELLANEOUS) ×2 IMPLANT
NS IRRIG 1000ML POUR BTL (IV SOLUTION) ×2 IMPLANT
PACK TOTAL JOINT (CUSTOM PROCEDURE TRAY) ×2 IMPLANT
PACK UNIVERSAL I (CUSTOM PROCEDURE TRAY) ×2 IMPLANT
PAD ARMBOARD 7.5X6 YLW CONV (MISCELLANEOUS) ×4 IMPLANT
PADDING CAST COTTON 6X4 STRL (CAST SUPPLIES) ×2 IMPLANT
RUBBERBAND STERILE (MISCELLANEOUS) ×2 IMPLANT
SET HNDPC FAN SPRY TIP SCT (DISPOSABLE) ×1 IMPLANT
SPONGE GAUZE 4X4 12PLY STER LF (GAUZE/BANDAGES/DRESSINGS) ×1 IMPLANT
STRIP CLOSURE SKIN 1/2X4 (GAUZE/BANDAGES/DRESSINGS) ×2 IMPLANT
SUCTION FRAZIER TIP 10 FR DISP (SUCTIONS) ×2 IMPLANT
SUT ETHIBOND NAB CT1 #1 30IN (SUTURE) ×4 IMPLANT
SUT MNCRL AB 3-0 PS2 18 (SUTURE) ×2 IMPLANT
SUT VIC AB 0 CT1 27 (SUTURE) ×4
SUT VIC AB 0 CT1 27XBRD ANBCTR (SUTURE) ×2 IMPLANT
SUT VIC AB 2-0 CT1 27 (SUTURE) ×4
SUT VIC AB 2-0 CT1 TAPERPNT 27 (SUTURE) ×2 IMPLANT
SYR 30ML SLIP (SYRINGE) ×2 IMPLANT
TOWEL OR 17X24 6PK STRL BLUE (TOWEL DISPOSABLE) ×2 IMPLANT
TOWEL OR 17X26 10 PK STRL BLUE (TOWEL DISPOSABLE) ×2 IMPLANT
TRAY FOLEY CATH 16FR SILVER (SET/KITS/TRAYS/PACK) ×2 IMPLANT
WATER STERILE IRR 1000ML POUR (IV SOLUTION) ×2 IMPLANT

## 2014-09-17 NOTE — Op Note (Signed)
MRN:     [id number] DOB/AGE:    1941/01/22 / 73 y.o.       OPERATIVE REPORT    DATE OF PROCEDURE:  09/17/2014       PREOPERATIVE DIAGNOSIS:   Primary localized OA right knee      Estimated body mass index is 41.58 kg/(m^2) as calculated from the following:   Height as of this encounter: 5\' 7"  (1.702 m).   Weight as of this encounter: 120.458 kg (265 lb 9 oz).                                                        POSTOPERATIVE DIAGNOSIS:   Primary localized OA right knee                                                                      PROCEDURE:  Procedure(s): RIGHT TOTAL KNEE ARTHROPLASTY Using Depuy Sigma RP implants #3 Femur, #rTibia, 12.4mm sigma RP bearing, 32 Patella     SURGEON: Evellyn Tuff A    ASSISTANT:  Kirstin Shepperson PA-C   (Present and scrubbed throughout the case, critical for assistance with exposure, retraction, instrumentation, and closure.)         ANESTHESIA: GET with Femoral Nerve Block  DRAINS: foley, 2 medium hemovac in knee   TOURNIQUET TIME: 34VQQ   COMPLICATIONS:  None     SPECIMENS: None   INDICATIONS FOR PROCEDURE: The patient has  djd right knee, varus deformities, XR shows bone on bone arthritis. Patient has failed all conservative measures including anti-inflammatory medicines, narcotics, attempts at  exercise and weight loss, cortisone injections and viscosupplementation.  Risks and benefits of surgery have been discussed, questions answered.   DESCRIPTION OF PROCEDURE: The patient identified by armband, received  right femoral nerve block and IV antibiotics, in the holding area at [facility] Patient taken to the operating room, appropriate anesthetic  monitors were attached General endotracheal anesthesia induced with  the patient in supine position, Foley catheter was inserted. Tourniquet  applied high to the operative thigh. Lateral post and foot positioner  applied to the table, the lower extremity was then prepped and draped   in usual sterile fashion from the ankle to the tourniquet. Time-out procedure was performed. The limb was wrapped with an Esmarch bandage and the tourniquet inflated to 365 mmHg. We began the operation by making the anterior midline incision starting at handbreadth above the patella going over the patella 1 cm medial to and  4 cm distal to the tibial tubercle. Small bleeders in the skin and the  subcutaneous tissue identified and cauterized. Transverse retinaculum was incised and reflected medially and a medial parapatellar arthrotomy was accomplished. the patella was everted and theprepatellar fat pad resected. The superficial medial collateral  ligament was then elevated from anterior to posterior along the proximal  flare of the tibia and anterior half of the menisci resected. The knee was hyperflexed exposing bone on bone arthritis. Peripheral and notch osteophytes as well as the cruciate ligaments were then resected. We continued to  work our way around  posteriorly along the proximal tibia, and externally  rotated the tibia subluxing it out from underneath the femur. A McHale  retractor was placed through the notch and a lateral Hohmann retractor  placed, and we then drilled through the proximal tibia in line with the  axis of the tibia followed by an intramedullary guide rod and 2-degree  posterior slope cutting guide. The tibial cutting guide was pinned into place  allowing resection of 4 mm of bone medially and about 6 mm of bone  laterally because of her varus deformity. Satisfied with the tibial resection, we then  entered the distal femur 2 mm anterior to the PCL origin with the  intramedullary guide rod and applied the distal femoral cutting guide  set at 101mm, with 5 degrees of valgus. This was pinned along the  epicondylar axis. At this point, the distal femoral cut was accomplished without difficulty. We then sized for a #3 femoral component and pinned the guide in 3 degrees of  external rotation.The chamfer cutting guide was pinned into place. The anterior, posterior, and chamfer cuts were accomplished without difficulty followed by  the Sigma RP box cutting guide and the box cut. We also removed posterior osteophytes from the posterior femoral condyles. At this  time, the knee was brought into full extension. We checked our  extension and flexion gaps and found them symmetric at 12.88mm.  The patella thickness measured at 25 mm. We set the cutting guide at 15 and removed the posterior 9.5-10 mm  of the patella sized for 32 button and drilled the lollipop. The knee  was then once again hyperflexed exposing the proximal tibia. We sized for a #4 tibial base plate, applied the smokestack and the conical reamer followed by the the Delta fin keel punch. We then hammered into place the Sigma RP trial femoral component, inserted a 12.5-mm trial bearing, trial patellar button, and took the knee through range of motion from 0-130 degrees. No thumb pressure was required for patellar  tracking. At this point, all trial components were removed, a double batch of DePuy HV cement with  was mixed and applied to all bony metallic mating surfaces except for the posterior condyles of the femur itself. In order, we  hammered into place the tibial tray and removed excess cement, the femoral component and removed excess cement, a 12.5-mm Sigma RP bearing  was inserted, and the knee brought to full extension with compression.  The patellar button was clamped into place, and excess cement  removed. While the cement cured the wound was irrigated out with normal saline solution pulse lavage, and medium Hemovac drains were placed.. Ligament stability and patellar tracking were checked and found to be excellent. The tourniquet was then released and hemostasis was obtained with cautery. The parapatellar arthrotomy was closed with  #1 ethibond suture. The subcutaneous tissue with 0 and 2-0 undyed  Vicryl  suture, and 4-0 Monocryl.. A dressing of Xeroform,  4 x 4, dressing sponges, Webril, and Ace wrap applied. Needle and sponge count were correct times 2.The patient awakened, extubated, and taken to recovery room without difficulty. Vascular status was normal, pulses 2+ and symmetric.   Shaelin Lalley A 09/17/2014, 10:39 AM

## 2014-09-17 NOTE — Anesthesia Postprocedure Evaluation (Signed)
  Anesthesia Post-op Note  Patient: Joe [name]  Procedure(s) Performed: Procedure(s): RIGHT TOTAL KNEE ARTHROPLASTY Steroid injection LEFT KNEE (Right)  Patient Location: PACU  Anesthesia Type:GA combined with regional for post-op pain  Level of Consciousness: awake, alert  and oriented  Airway and Oxygen Therapy: Patient Spontanous Breathing  Post-op Pain: mild  Post-op Assessment: Post-op Vital signs reviewed  Post-op Vital Signs: Reviewed  Last Vitals:  Filed Vitals:   09/17/14 1215  BP: 145/67  Pulse: 74  Temp: 36.4 C  Resp: 14    Complications: No apparent anesthesia complications

## 2014-09-17 NOTE — Discharge Instructions (Signed)
Information on my medicine - ELIQUIS® (apixaban) ° °This medication education was reviewed with me or my healthcare representative as part of my discharge preparation.  The pharmacist that spoke with me during my hospital stay was:  Merrit Waugh P, RPH ° °Why was Eliquis® prescribed for you? °Eliquis® was prescribed for you to reduce the risk of blood clots forming after orthopedic surgery.   ° °What do You need to know about Eliquis®? °Take your Eliquis® TWICE DAILY - one tablet in the morning and one tablet in the evening with or without food.  It would be best to take the dose about the same time each day. ° °If you have difficulty swallowing the tablet whole please discuss with your pharmacist how to take the medication safely. ° °Take Eliquis® exactly as prescribed by your doctor and DO NOT stop taking Eliquis® without talking to the doctor who prescribed the medication.  Stopping without other medication to take the place of Eliquis® may increase your risk of developing a clot. ° °After discharge, you should have regular check-up appointments with your healthcare provider that is prescribing your Eliquis®. ° °What do you do if you miss a dose? °If a dose of ELIQUIS® is not taken at the scheduled time, take it as soon as possible on the same day and twice-daily administration should be resumed.  The dose should not be doubled to make up for a missed dose.  Do not take more than one tablet of ELIQUIS at the same time. ° °Important Safety Information °A possible side effect of Eliquis® is bleeding. You should call your healthcare provider right away if you experience any of the following: °? Bleeding from an injury or your nose that does not stop. °? Unusual colored urine (red or dark brown) or unusual colored stools (red or black). °? Unusual bruising for unknown reasons. °? A serious fall or if you hit your head (even if there is no bleeding). ° °Some medicines may interact with Eliquis® and might increase  your risk of bleeding or clotting while on Eliquis®. To help avoid this, consult your healthcare provider or pharmacist prior to using any new prescription or non-prescription medications, including herbals, vitamins, non-steroidal anti-inflammatory drugs (NSAIDs) and supplements. ° °This website has more information on Eliquis® (apixaban): http://www.eliquis.com/eliquis/home °

## 2014-09-17 NOTE — Evaluation (Signed)
Physical Therapy Evaluation Patient Details Name: Joe [name] MRN: [id number] DOB: [birth year] Today's Date: 09/17/2014   History of Present Illness  Patietn is a 73 y/o male s/p R TKA. WBAT RLE. PMH of HTN, HLD, syncope and depression.  Clinical Impression  Patient presents with post surgical deficits of RLE secondary to above surgery. S/p steroid injection left knee. Presents with pain, weakness and dyspnea impacting safe mobility. Tolerated short distance ambulation to chair without knee buckling. Drop in Sa02 with activity. Pt would benefit from skilled PT to improve transfers, gait, balance and mobility so pt can maximize independence and return to PLOF.    Follow Up Recommendations Home health PT;Supervision/Assistance - 24 hour    Equipment Recommendations  None recommended by PT    Recommendations for Other Services       Precautions / Restrictions Precautions Precautions: Knee;Fall Precaution Booklet Issued: Yes (comment) Precaution Comments: Reviewed HEP and precautions. Required Braces or Orthoses: Knee Immobilizer - Right Knee Immobilizer - Right: Discontinue once straight leg raise with < 10 degree lag Restrictions Weight Bearing Restrictions: Yes RLE Weight Bearing: Weight bearing as tolerated      Mobility  Bed Mobility Overal bed mobility: Needs Assistance Bed Mobility: Supine to Sit     Supine to sit: Min guard;HOB elevated     General bed mobility comments: VC's for technique. Use of rails for support. Increased effort. Cues to maintain breathing during transfer as pt holding breath.  Transfers Overall transfer level: Needs assistance Equipment used: Rolling walker (2 wheeled) Transfers: Sit to/from Stand Sit to Stand: Min guard         General transfer comment: Stood from EOB x2. Min guard for steadying. VC's for hand placement and not to pull up on RW.  Ambulation/Gait Ambulation/Gait assistance: Min guard Ambulation Distance (Feet): 6  Feet Assistive device: Rolling walker (2 wheeled) Gait Pattern/deviations: Decreased stride length;Step-to pattern;Trunk flexed;Decreased stance time - right;Decreased step length - left   Gait velocity interpretation: Below normal speed for age/gender General Gait Details: Pt with slow, unsteady gait. Min guard for balance/safety. Dyspnea present. Sa02 dropped to 89% on 2L 02 [place]. Side stepped along side bed ~3', ambulated to chair.  Stairs            Wheelchair Mobility    Modified Rankin (Stroke Patients Only)       Balance Overall balance assessment: Needs assistance Sitting-balance support: Feet supported;No upper extremity supported Sitting balance-[name]: Good     Standing balance support: During functional activity;Bilateral upper extremity supported Standing balance-[name]: Poor Standing balance comment: Requires BUE support on RW for balance/safety.                             Pertinent Vitals/Pain Pain Assessment: 0-10 Pain Score: 7  Pain Location: right knee Pain Descriptors / Indicators: Sore;Aching Pain Intervention(s): Limited activity within patient's tolerance;Monitored during session;Repositioned;Ice applied    Home Living Family/patient expects to be discharged to:: Private residence Living Arrangements: Spouse/significant other Available Help at Discharge: Family;Available 24 hours/day Type of Home: House Home Access: Level entry     Home Layout: One level Home Equipment: Walker - 2 wheels;Cane - single point      Prior Function Level of Independence: Independent               Hand Dominance        Extremity/Trunk Assessment   Upper Extremity Assessment: Overall WFL for tasks assessed  Lower Extremity Assessment: Generalized weakness;RLE deficits/detail;LLE deficits/detail RLE Deficits / Details: Limited AROM hip/knee, ankle AROM WFL. No knee buckling noted upon standing. KI donned. LLE  Deficits / Details: Strength WFL.     Communication   Communication: No difficulties  Cognition Arousal/Alertness: Awake/alert Behavior During Therapy: WFL for tasks assessed/performed Overall Cognitive Status: Within Functional Limits for tasks assessed                      General Comments General comments (skin integrity, edema, etc.): Surgical site not visible due to post op bandage. Remained on supplemental 02 during evaluation due to dyspnea.    Exercises Total Joint Exercises Ankle Circles/Pumps: Both;15 reps;Seated Quad Sets: Both;10 reps;Seated (3-5 sec hold.) Gluteal Sets: Both;10 reps;Seated      Assessment/Plan    PT Assessment Patient needs continued PT services  PT Diagnosis Difficulty walking;Generalized weakness;Acute pain   PT Problem List Decreased strength;Pain;Cardiopulmonary status limiting activity;Decreased range of motion;Impaired sensation;Decreased activity tolerance;Decreased mobility;Decreased balance  PT Treatment Interventions Balance training;Gait training;Patient/family education;Functional mobility training;Therapeutic activities;Therapeutic exercise   PT Goals (Current goals can be found in the Care Plan section) Acute Rehab PT Goals Patient Stated Goal: to return to independence. PT Goal Formulation: With patient Time For Goal Achievement: 10/01/14 Potential to Achieve Goals: Good    Frequency 7X/week   Barriers to discharge        Co-evaluation               End of Session Equipment Utilized During Treatment: Gait belt Activity Tolerance: Patient limited by pain Patient left: in chair;with call bell/phone within reach Nurse Communication: Mobility status;Precautions;Weight bearing status         Time: 8850-2774 PT Time Calculation (min) (ACUTE ONLY): 28 min   Charges:   PT Evaluation $Initial PT Evaluation Tier I: 1 Procedure PT Treatments $Therapeutic Activity: 8-22 mins   PT G CodesCandy Sledge A 09/17/2014, 2:18 PM Candy Sledge, [place], DPT [phone]

## 2014-09-17 NOTE — Transfer of Care (Signed)
Immediate Anesthesia Transfer of Care Note  Patient: Joe [name]  Procedure(s) Performed: Procedure(s): RIGHT TOTAL KNEE ARTHROPLASTY Steroid injection LEFT KNEE (Right)  Patient Location: PACU  Anesthesia Type:General and Regional  Level of Consciousness: awake, alert  and oriented  Airway & Oxygen Therapy: Patient Spontanous Breathing and Patient connected to nasal cannula oxygen  Post-op Assessment: Report given to PACU RN, Post -op Vital signs reviewed and stable and Patient moving all extremities X 4  Post vital signs: Reviewed and stable  Complications: No apparent anesthesia complications

## 2014-09-17 NOTE — Anesthesia Preprocedure Evaluation (Addendum)
Anesthesia Evaluation  Patient identified by MRN, date of birth, ID band Patient awake    Reviewed: Allergy & Precautions, H&P , NPO status , Patient's Chart, lab work & pertinent test results  Airway Mallampati: III  TM Distance: >3 FB Neck ROM: Full    Dental  (+) Dental Advisory Given, Teeth Intact   Pulmonary shortness of breath and with exertion, sleep apnea and Continuous Positive Airway Pressure Ventilation , former smoker,          Cardiovascular hypertension, Pt. on medications     Neuro/Psych PSYCHIATRIC DISORDERS Depression    GI/Hepatic   Endo/Other  Morbid obesity  Renal/GU      Musculoskeletal  (+) Arthritis -, Osteoarthritis,    Abdominal   Peds  Hematology   Anesthesia Other Findings   Reproductive/Obstetrics                          Anesthesia Physical Anesthesia Plan  ASA: III  Anesthesia Plan: General and Regional   Post-op Pain Management:    Induction: Intravenous  Airway Management Planned: Oral ETT  Additional Equipment:   Intra-op Plan:   Post-operative Plan: Extubation in OR  Informed Consent: I have reviewed the patients History and Physical, chart, labs and discussed the procedure including the risks, benefits and alternatives for the proposed anesthesia with the patient or authorized representative who has indicated his/her understanding and acceptance.   Dental advisory given  Plan Discussed with: CRNA, Anesthesiologist and Surgeon  Anesthesia Plan Comments:        Anesthesia Quick Evaluation

## 2014-09-17 NOTE — Progress Notes (Signed)
Utilization review completed.  

## 2014-09-17 NOTE — Progress Notes (Signed)
Pt. Placed on cpap. Pt. Uses cpap at home each night. Pt. Tolerating well at this time.

## 2014-09-17 NOTE — Progress Notes (Signed)
Orthopedic Tech Progress Note Patient Details:  Joe [name] 20-Dec-1940 [id number] CPM applied to RLE with appropriate settings. OHF applied to bed. Footsie roll provided.  CPM Right Knee CPM Right Knee: On Right Knee Flexion (Degrees): 90 Right Knee Extension (Degrees): 0 Additional Comments: over head fraME   [name] 09/17/2014, 12:07 PM

## 2014-09-17 NOTE — Interval H&P Note (Signed)
History and Physical Interval Note:  09/17/2014 8:45 AM  Joe [name]  has presented today for surgery, with the diagnosis of primary localized OA right knee  The various methods of treatment have been discussed with the patient and family. After consideration of risks, benefits and other options for treatment, the patient has consented to  Procedure(s): RIGHT TOTAL KNEE ARTHROPLASTY (Right) as a surgical intervention .  The patient's history has been reviewed, patient examined, no change in status, stable for surgery.  I have reviewed the patient's chart and labs.  Questions were answered to the patient's satisfaction.     [name] A

## 2014-09-17 NOTE — Anesthesia Procedure Notes (Addendum)
Anesthesia Regional Block:  Adductor canal block  Pre-Anesthetic Checklist: ,, timeout performed, Correct Patient, Correct Site, Correct Laterality, Correct Procedure, Correct Position, site marked, Risks and benefits discussed,  Surgical consent,  Pre-op evaluation,  At surgeon's request and post-op pain management  Laterality: Right  Prep: chloraprep       Needles:  Injection technique: Single-shot  Needle Type: Echogenic Needle     Needle Length: 9cm 9 cm Needle Gauge: 21 and 21 G    Additional Needles:  Procedures: ultrasound guided (picture in chart) Adductor canal block Narrative:  Start time: 09/17/2014 8:25 AM End time: 09/17/2014 8:35 AM Injection made incrementally with aspirations every 5 mL.  Performed by: Personally  Anesthesiologist: Suzette Battiest E  Additional Notes: Risks and benefits explained. Pt tolerated procedure without immediate complications.   Procedure Name: Intubation Date/Time: 09/17/2014 9:05 AM Performed by: Garrison [place] T Pre-anesthesia Checklist: Patient identified, Emergency Drugs available, Suction available and Patient being monitored Patient Re-evaluated:Patient Re-evaluated prior to inductionOxygen Delivery Method: Circle system utilized Preoxygenation: Pre-oxygenation with 100% oxygen Intubation Type: IV induction Ventilation: Mask ventilation without difficulty and Oral airway inserted - appropriate to patient size Laryngoscope Size: Sabra Heck and 2 Grade View: Grade II Tube type: Oral Tube size: 7.5 mm Number of attempts: 1 Airway Equipment and Method: Stylet and Oral airway Placement Confirmation: ETT inserted through vocal cords under direct vision,  positive ETCO2 and breath sounds checked- equal and bilateral Secured at: 23 cm Tube secured with: Tape Dental Injury: Teeth and Oropharynx as per pre-operative assessment

## 2014-09-18 ENCOUNTER — Encounter (HOSPITAL_COMMUNITY): Payer: Self-pay | Admitting: Orthopedic Surgery

## 2014-09-18 LAB — BASIC METABOLIC PANEL
Anion gap: 14 (ref 5–15)
BUN: 15 mg/dL (ref 6–23)
CHLORIDE: 96 meq/L (ref 96–112)
CO2: 25 mEq/L (ref 19–32)
Calcium: 8.5 mg/dL (ref 8.4–10.5)
Creatinine, Ser: 0.7 mg/dL (ref 0.50–1.35)
GFR calc non Af Amer: >90 mL/min (ref >90)
GLUCOSE: 168 mg/dL — AB (ref 70–99)
POTASSIUM: 4 meq/L (ref 3.7–5.3)
Sodium: 135 mEq/L — ABNORMAL LOW (ref 137–147)

## 2014-09-18 LAB — CBC
HEMATOCRIT: 38 % — AB (ref 39.0–52.0)
HEMOGLOBIN: 12 g/dL — AB (ref 13.0–17.0)
MCH: 25.3 pg — ABNORMAL LOW (ref 26.0–34.0)
MCHC: 31.6 g/dL (ref 30.0–36.0)
MCV: 80.2 fL (ref 78.0–100.0)
Platelets: 194 10*3/uL (ref 150–400)
RBC: 4.74 MIL/uL (ref 4.22–5.81)
RDW: 13.9 % (ref 11.5–15.5)
WBC: 16.4 10*3/uL — AB (ref 4.0–10.5)

## 2014-09-18 MED ORDER — POLYETHYLENE GLYCOL 3350 17 G PO PACK: 17.0000 g | PACK | Freq: Two times a day (BID) | ORAL | Status: DC

## 2014-09-18 MED ORDER — CELECOXIB 200 MG PO CAPS: ORAL_CAPSULE | ORAL | Status: DC

## 2014-09-18 MED ORDER — APIXABAN 2.5 MG PO TABS: 2.5000 mg | ORAL_TABLET | Freq: Two times a day (BID) | ORAL | Status: DC

## 2014-09-18 MED ORDER — OXYCODONE HCL ER 20 MG PO T12A: 20.0000 mg | EXTENDED_RELEASE_TABLET | Freq: Two times a day (BID) | ORAL | Status: DC

## 2014-09-18 MED ORDER — TAMSULOSIN HCL 0.4 MG PO CAPS
0.4000 mg | ORAL_CAPSULE | Freq: Once | ORAL | Status: AC
  Administered 2014-09-18: 0.4 mg via ORAL
  Filled 2014-09-18: qty 1

## 2014-09-18 MED ORDER — SODIUM CHLORIDE 0.9 % IV BOLUS (SEPSIS)
500.0000 mL | Freq: Once | INTRAVENOUS | Status: AC
  Administered 2014-09-18: 500 mL via INTRAVENOUS

## 2014-09-18 MED ORDER — DSS 100 MG PO CAPS: 100.0000 mg | ORAL_CAPSULE | Freq: Two times a day (BID) | ORAL | Status: DC

## 2014-09-18 MED ORDER — OXYCODONE HCL 5 MG PO TABS: ORAL_TABLET | ORAL | Status: DC

## 2014-09-18 NOTE — Progress Notes (Signed)
Physical Therapy Treatment Patient Details Name: Joe [name] MRN: [id number] DOB: [birth year] Today's Date: 09/18/2014    History of Present Illness Patietn is a 73 y/o male s/p R TKA. WBAT RLE. PMH of HTN, HLD, syncope and depression.    PT Comments    Patient progressing well with overall mobility. Able to complete HEP with AROM. Patient safe to D/C from a mobility standpoint based on progression towards goals set on PT eval.    Follow Up Recommendations  Home health PT;Supervision/Assistance - 24 hour     Equipment Recommendations  None recommended by PT    Recommendations for Other Services       Precautions / Restrictions Precautions Precautions: Knee;Fall Precaution Comments: Reviewed HEP and precautions. Knee Immobilizer - Right: Discontinue once straight leg raise with < 10 degree lag Restrictions RLE Weight Bearing: Weight bearing as tolerated    Mobility  Bed Mobility               General bed mobility comments: Patient up in recliner before and after session  Transfers Overall transfer level: Needs assistance Equipment used: Rolling walker (2 wheeled)   Sit to Stand: Supervision         General transfer comment: Patient with safe technique.   Ambulation/Gait Ambulation/Gait assistance: Supervision Ambulation Distance (Feet): 300 Feet Assistive device: Rolling walker (2 wheeled) Gait Pattern/deviations: Step-through pattern;Decreased stride length Gait velocity: guarded  Gait velocity interpretation: Below normal speed for age/gender General Gait Details: Patient with slow but steady gait. Cues to increase weight through LEs as tolerated.    Stairs            Wheelchair Mobility    Modified Rankin (Stroke Patients Only)       Balance                                    Cognition Arousal/Alertness: Awake/alert Behavior During Therapy: WFL for tasks assessed/performed Overall Cognitive Status: Within Functional  Limits for tasks assessed                      Exercises Total Joint Exercises Quad Sets: AROM;Right;10 reps Heel Slides: AROM;Right;10 reps Hip ABduction/ADduction: AROM;Right;10 reps Straight Leg Raises: 10 reps;Right;AROM Long Arc Quad: AROM;Right;10 reps    General Comments        Pertinent Vitals/Pain Pain Assessment: 0-10 Pain Score: 8  Pain Location: Right Knee Pain Descriptors / Indicators: Sore Pain Intervention(s): Monitored during session    Home Living                      Prior Function            PT Goals (current goals can now be found in the care plan section) Progress towards PT goals: Progressing toward goals    Frequency  7X/week    PT Plan Current plan remains appropriate    Co-evaluation             End of Session Equipment Utilized During Treatment: Gait belt Activity Tolerance: Patient tolerated treatment well Patient left: in chair;with call bell/phone within reach     Time: 0835-0859 PT Time Calculation (min) (ACUTE ONLY): 24 min  Charges:  $Gait Training: 8-22 mins $Therapeutic Exercise: 8-22 mins                    G Codes:  Jacqualyn Posey 09/18/2014, 9:07 AM 09/18/2014 Jacqualyn Posey PTA [phone] pager [phone] office

## 2014-09-18 NOTE — Care Management Note (Signed)
CARE MANAGEMENT NOTE 09/18/2014  Patient:  Joe [name], Joe [name]   Account Number:  [id number]  Date Initiated:  09/17/2014  Documentation initiated by:  Ricki Miller  Subjective/Objective Assessment:   73 yr old male admitted with osteoarthritis of the right knee. Patient had a right total knee arthroplasty.     Action/Plan:   PT/OT eval.  Patient preoperatively setup with [facility], no changes.  Patient has family support at discharge.   Anticipated DC Date:  09/18/2014   Anticipated DC Plan:  [place]  CM consult      [facility] Choice  HOME HEALTH  DURABLE MEDICAL EQUIPMENT   Choice offered to / List presented to:  C-1 Patient   DME arranged  [facility] [employer] [facility]  3-N-1  CPM      DME agency  [employer]     [place] arranged  HH-2 PT      [place]   Status of service:  Completed, signed off Medicare Important Message given?  NA - LOS <3 / Initial given by admissions (If response is "NO", the following Medicare IM given date fields will be blank) Date Medicare IM given:   Medicare IM given by:   Date Additional Medicare IM given:   Additional Medicare IM given by:    Discharge Disposition:  [place]

## 2014-09-18 NOTE — Progress Notes (Signed)
Joe [name] to be D/C'd Home per MD order. Discussed with the patient and all questions fully answered.    Medication List    STOP taking these medications        aspirin EC 81 MG tablet     Fish Oil 1000 MG Caps     levofloxacin 500 MG tablet  Commonly known as:  LEVAQUIN     meclizine 25 MG tablet  Commonly known as:  ANTIVERT     meloxicam 15 MG tablet  Commonly known as:  MOBIC      TAKE these medications        amLODipine 5 MG tablet  Commonly known as:  NORVASC  Take 5 mg by mouth daily.     apixaban 2.5 MG Tabs tablet  Commonly known as:  ELIQUIS  Take 1 tablet (2.5 mg total) by mouth 2 (two) times daily.     celecoxib 200 MG capsule  Commonly known as:  CELEBREX  1 tab po q day with food for pain and  swelling     citalopram 20 MG tablet  Commonly known as:  CELEXA  Take 20 mg by mouth daily.     DSS 100 MG Caps  Take 100 mg by mouth 2 (two) times daily.     lisinopril-hydrochlorothiazide 20-12.5 MG per tablet  Commonly known as:  PRINZIDE,ZESTORETIC  Take 2 tablets by mouth daily.     loratadine 10 MG tablet  Commonly known as:  CLARITIN  Take 1 tablet (10 mg total) by mouth daily.     oxyCODONE 5 MG immediate release tablet  Commonly known as:  Oxy IR/ROXICODONE  1-2 tablets every 4-6 hrs as needed for pain     OxyCODONE 20 mg T12a 12 hr tablet  Commonly known as:  OXYCONTIN  Take 1 tablet (20 mg total) by mouth every 12 (twelve) hours.     polyethylene glycol packet  Commonly known as:  MIRALAX / GLYCOLAX  Take 17 g by mouth 2 (two) times daily.     simvastatin 20 MG tablet  Commonly known as:  ZOCOR  Take 20 mg by mouth daily.     tamsulosin 0.4 MG Caps capsule  Commonly known as:  FLOMAX  Take 0.4 mg by mouth daily.        VVS, Skin clean, dry and intact without evidence of skin break down, no evidence of skin tears noted.  IV catheter discontinued intact. Site without signs and symptoms of complications. Dressing and pressure  applied.  An After Visit Summary was printed and given to the patient.  Patient escorted via [place], and D/C home via private auto.  [place] Numbers  09/18/2014 5:07 PM

## 2014-09-18 NOTE — Evaluation (Signed)
Occupational Therapy Evaluation Patient Details Name: Joe [name] MRN: [id number] DOB: [birth year] Today's Date: 09/18/2014    History of Present Illness Patient is a 73 y/o male s/p R TKA. WBAT RLE. PMH of HTN, HLD, syncope and depression.   Clinical Impression   Pt was independent in ADL and IADL prior to admission.  He now requires supervision for toilet transfers and standing activities and min assist for LB ADL.  Pt was educated in use of DME and reacher, safe footwear, transporting items with RW, and home safety.  Pt will have assist of his wife and son upon discharge.  No further OT needs. Will defer tub transfer practice to [place] when pt has adequate strength and ROM.    Follow Up Recommendations  No OT follow up;Supervision/Assistance - 24 hour    Equipment Recommendations  None recommended by OT    Recommendations for Other Services       Precautions / Restrictions Precautions Precautions: Knee;Fall Precaution Comments: Reviewed HEP and precautions. Required Braces or Orthoses: Knee Immobilizer - Right Knee Immobilizer - Right: Discontinue once straight leg raise with < 10 degree lag Restrictions Weight Bearing Restrictions: Yes RLE Weight Bearing: Weight bearing as tolerated      Mobility Bed Mobility               General bed mobility comments: Patient up in recliner before and after session  Transfers Overall transfer level: Needs assistance Equipment used: Rolling walker (2 wheeled)   Sit to Stand: Supervision         General transfer comment: Patient with safe technique.     Balance                                            ADL Overall ADL's : Needs assistance/impaired Eating/Feeding: Independent;Sitting   Grooming: Wash/dry hands;Standing;Supervision/safety   Upper Body Bathing: Set up;Sitting   Lower Body Bathing: Minimal assistance;Sit to/from stand   Upper Body Dressing : Set up;Sitting   Lower Body Dressing:  Minimal assistance;Sit to/from stand   Toilet Transfer: Supervision/safety;Ambulation;RW   Toileting- Clothing Manipulation and Hygiene: Supervision/safety;Sit to/from stand       Functional mobility during ADLs: Supervision/safety;Rolling walker General ADL Comments: Instructed in use of reacher for LB dressing, starting with R LE.  Pt is aware of the multiple uses of 3 in 1.  Instructed in safe footwear and transporting items with walker. Pt will practice tub transfer with HHPT prior to attempting on his own.     Vision                     Perception     Praxis      Pertinent Vitals/Pain Pain Assessment: Faces Pain Score: 8  Faces Pain Scale: Hurts even more Pain Location: R knee Pain Descriptors / Indicators: Sore Pain Intervention(s): Premedicated before session;Repositioned;Monitored during session     Hand Dominance Right   Extremity/Trunk Assessment Upper Extremity Assessment Upper Extremity Assessment: Overall WFL for tasks assessed   Lower Extremity Assessment Lower Extremity Assessment: Defer to PT evaluation       Communication Communication Communication: No difficulties   Cognition Arousal/Alertness: Awake/alert Behavior During Therapy: WFL for tasks assessed/performed Overall Cognitive Status: Within Functional Limits for tasks assessed  General Comments       Exercises       Shoulder Instructions      Home Living Family/patient expects to be discharged to:: Private residence Living Arrangements: Spouse/significant other Available Help at Discharge: Family;Available 24 hours/day Type of Home: House Home Access: Level entry     Home Layout: One level     Bathroom Shower/Tub: Teacher, early years/pre: Standard     Home Equipment: Environmental consultant - 2 wheels;Cane - single point;Bedside commode;Adaptive equipment;Shower Theme park manager: Reacher        Prior Functioning/Environment Level of  Independence: Independent             OT Diagnosis:     OT Problem List:     OT Treatment/Interventions:      OT Goals(Current goals can be found in the care plan section) Acute Rehab OT Goals Patient Stated Goal: to return to independence.  OT Frequency:     Barriers to D/C:            Co-evaluation              End of Session Equipment Utilized During Treatment: Rolling walker;Gait belt;Right knee immobilizer CPM Right Knee CPM Right Knee: Off Right Knee Flexion (Degrees): 90 Right Knee Extension (Degrees): 0  Activity Tolerance: Patient tolerated treatment well Patient left: in chair;with call bell/phone within reach   Time: 0940-1000 OT Time Calculation (min): 20 min Charges:  OT General Charges $OT Visit: 1 Procedure OT Evaluation $Initial OT Evaluation Tier I: 1 Procedure OT Treatments $Self Care/Home Management : 8-22 mins G-Codes:    Malka So 09/18/2014, 10:01 AM

## 2014-09-26 NOTE — Discharge Summary (Signed)
Patient ID: Joe [name] MRN: [id number] DOB/AGE: 05/26/1941 73 y.o.  Admit date: 09/17/2014 Discharge date: 09/18/2014  Admission Diagnoses:  Principal Problem:   Primary localized osteoarthritis of right knee Active Problems:   HTN (hypertension)   Depression   RBBB   Sleep apnea   DJD (degenerative joint disease) of knee   Discharge Diagnoses:  Same  Past Medical History  Diagnosis Date  . Hypertension   . Syncope 12/11/2012  . Depression   . Hyperlipidemia   . Sleep apnea     CPAP  . Primary localized osteoarthritis of right knee   . Shortness of breath dyspnea     Surgeries: Procedure(s): RIGHT TOTAL KNEE ARTHROPLASTY Steroid injection LEFT KNEE on 09/17/2014   Consultants:    Discharged Condition: Improved  Hospital Course: Joe [name] is an 73 y.o. male who was admitted 09/17/2014 for operative treatment ofPrimary localized osteoarthritis of right knee. Patient has severe unremitting pain that affects sleep, daily activities, and work/hobbies. After pre-op clearance the patient was taken to the operating room on 09/17/2014 and underwent  Procedure(s): RIGHT TOTAL KNEE ARTHROPLASTY Steroid injection LEFT KNEE.    Patient was given perioperative antibiotics:  Anti-infectives    Start     Dose/Rate Route Frequency Ordered Stop   09/17/14 2100  vancomycin (VANCOCIN) IVPB 1000 mg/200 mL premix     1,000 mg200 mL/hr over 60 Minutes Intravenous Every 12 hours 09/17/14 1241 09/17/14 2211   09/17/14 0600  vancomycin (VANCOCIN) 1,500 mg in sodium chloride 0.9 % 500 mL IVPB     1,500 mg250 mL/hr over 120 Minutes Intravenous On call to O.R. 09/16/14 1309 09/17/14 0942       Patient was given sequential compression devices, early ambulation, and chemoprophylaxis to prevent DVT.  Patient benefited maximally from hospital stay and there were no complications.    Recent vital signs: No data found.    Recent laboratory studies: No results for input(s): WBC, HGB, HCT,  PLT, NA, K, CL, CO2, BUN, CREATININE, GLUCOSE, INR, CALCIUM in the last 72 hours.  Invalid input(s): PT, 2   Discharge Medications:     Medication List    STOP taking these medications        aspirin EC 81 MG tablet     Fish Oil 1000 MG Caps     levofloxacin 500 MG tablet  Commonly known as:  LEVAQUIN     meclizine 25 MG tablet  Commonly known as:  ANTIVERT     meloxicam 15 MG tablet  Commonly known as:  MOBIC      TAKE these medications        amLODipine 5 MG tablet  Commonly known as:  NORVASC  Take 5 mg by mouth daily.     apixaban 2.5 MG Tabs tablet  Commonly known as:  ELIQUIS  Take 1 tablet (2.5 mg total) by mouth 2 (two) times daily.     celecoxib 200 MG capsule  Commonly known as:  CELEBREX  1 tab po q day with food for pain and  swelling     citalopram 20 MG tablet  Commonly known as:  CELEXA  Take 20 mg by mouth daily.     DSS 100 MG Caps  Take 100 mg by mouth 2 (two) times daily.     lisinopril-hydrochlorothiazide 20-12.5 MG per tablet  Commonly known as:  PRINZIDE,ZESTORETIC  Take 2 tablets by mouth daily.     loratadine 10 MG tablet  Commonly known as:  CLARITIN  Take 1 tablet (10 mg total) by mouth daily.     oxyCODONE 5 MG immediate release tablet  Commonly known as:  Oxy IR/ROXICODONE  1-2 tablets every 4-6 hrs as needed for pain     OxyCODONE 20 mg T12a 12 hr tablet  Commonly known as:  OXYCONTIN  Take 1 tablet (20 mg total) by mouth every 12 (twelve) hours.     polyethylene glycol packet  Commonly known as:  MIRALAX / GLYCOLAX  Take 17 g by mouth 2 (two) times daily.     simvastatin 20 MG tablet  Commonly known as:  ZOCOR  Take 20 mg by mouth daily.     tamsulosin 0.4 MG Caps capsule  Commonly known as:  FLOMAX  Take 0.4 mg by mouth daily.        Diagnostic Studies: Dg Chest 2 View  09/10/2014   CLINICAL DATA:  Preop for right total knee arthroplasty  EXAM: CHEST  2 VIEW  COMPARISON:  None.  FINDINGS: Cardiomediastinal  silhouette is unremarkable. No acute infiltrate or pleural effusion. No pulmonary edema. Bony thorax is unremarkable.  IMPRESSION: No active cardiopulmonary disease.   Electronically Signed   By: Lahoma Crocker M.D.   On: 09/10/2014 10:29    Disposition: 01-Home or Self Care      Discharge Instructions    CPM    Complete by:  As directed   Continuous passive motion machine (CPM):      Use the CPM from 0 to 90 for 6 hours per day.       You may break it up into 2 or 3 sessions per day.      Use CPM for 2 weeks or until you are told to stop.     Call MD / Call 911    Complete by:  As directed   If you experience chest pain or shortness of breath, CALL 911 and be transported to the hospital emergency room.  If you develope a fever above 101 F, pus (white drainage) or increased drainage or redness at the wound, or calf pain, call your surgeon's office.     Change dressing    Complete by:  As directed   Change the dressing daily with sterile 4 x 4 inch gauze dressing and apply TED hose. Keep aquacel bandage over wound.  Wash whole leg including over the bandage every day with soap and water.     Constipation Prevention    Complete by:  As directed   Drink plenty of fluids.  Prune juice may be helpful.  You may use a stool softener, such as Colace (over the counter) 100 mg twice a day.  Use MiraLax (over the counter) for constipation as needed.     Diet - low sodium heart healthy    Complete by:  As directed      Discharge instructions    Complete by:  As directed   Total Knee Replacement Care After Refer to this sheet in the next few weeks. These discharge instructions provide you with general information on caring for yourself after you leave the hospital. Your caregiver may also give you specific instructions. Your treatment has been planned according to the most current medical practices available, but unavoidable complications sometimes occur. If you have any problems or questions after  discharge, please call your caregiver. Regaining a near full range of motion of your knee within the first 3 to 6 weeks after surgery is critical. HOME CARE INSTRUCTIONS  You  may resume a normal diet and activities as directed.  Perform exercises as directed.  Place gray foam block, curve side up under heel at all times except when in CPM or when walking.  DO NOT modify, tear, cut, or change in any way the gray foam block. You will receive physical therapy daily  Take showers instead of baths until informed otherwise.  You may shower on Sunday.  Please wash whole leg including wound with soap and water  Change guaze bandages (dressings)daily.  Keep Aquacel bandage over wound.  Wash whole leg including over the bandage every day with soap and water. It is OK to take over-the-counter tylenol in addition to the oxycodone for pain, discomfort, or fever. Oxycodone is VERY constipating.  Please take stool softener twice a day and laxatives daily until bowels are regular Eat a well-balanced diet.  Avoid lifting or driving until you are instructed otherwise.  Make an appointment to see your caregiver for stitches (suture) or staple removal as directed.  If you have been sent home with a continuous passive motion machine (CPM machine), 0-90 degrees 6 hrs a day   2 hrs a shift SEEK MEDICAL CARE IF: You have swelling of your calf or leg.  You develop shortness of breath or chest pain.  You have redness, swelling, or increasing pain in the wound.  There is pus or any unusual drainage coming from the surgical site.  You notice a bad smell coming from the surgical site or dressing.  The surgical site breaks open after sutures or staples have been removed.  There is persistent bleeding from the suture or staple line.  You are getting worse or are not improving.  You have any other questions or concerns.  SEEK IMMEDIATE MEDICAL CARE IF:  You have a fever.  You develop a rash.  You have difficulty  breathing.  You develop any reaction or side effects to medicines given.  Your knee motion is decreasing rather than improving.  MAKE SURE YOU:  Understand these instructions.  Will watch your condition.  Will get help right away if you are not doing well or get worse.     Do not put a pillow under the knee. Place it under the heel.    Complete by:  As directed   Place yellow foam block, yellow side up under heel at all times except when in CPM or when walking.  DO NOT modify, tear, cut, or change in any way the yellow foam block.     Increase activity slowly as tolerated    Complete by:  As directed      TED hose    Complete by:  As directed   Use stockings (TED hose) for 2 weeks on both leg(s).  You may remove them at night for sleeping.              SignedLinda Hedges 09/26/2014, 7:31 AM

## 2015-01-16 ENCOUNTER — Other Ambulatory Visit: Payer: Self-pay | Admitting: Neurosurgery

## 2015-01-16 DIAGNOSIS — M5416 Radiculopathy, lumbar region: Secondary | ICD-10-CM

## 2015-01-18 ENCOUNTER — Ambulatory Visit
Admission: RE | Admit: 2015-01-18 | Discharge: 2015-01-18 | Disposition: A | Discharge status: Home / Self Care | Payer: Self-pay | Source: Ambulatory Visit | Attending: Neurosurgery | Admitting: Neurosurgery

## 2015-01-18 ENCOUNTER — Ambulatory Visit
Admission: RE | Admit: 2015-01-18 | Discharge: 2015-01-18 | Disposition: A | Discharge status: Home / Self Care | Payer: Medicare Other | Source: Ambulatory Visit | Attending: Neurosurgery | Admitting: Neurosurgery

## 2015-01-18 VITALS — BP 150/59 | HR 77

## 2015-01-18 DIAGNOSIS — M5416 Radiculopathy, lumbar region: Secondary | ICD-10-CM

## 2015-01-18 MED ORDER — DIAZEPAM 5 MG PO TABS
5.0000 mg | ORAL_TABLET | Freq: Once | ORAL | Status: AC
  Administered 2015-01-18: 5 mg via ORAL

## 2015-01-18 MED ORDER — ONDANSETRON HCL 4 MG/2ML IJ SOLN: 4.0000 mg | Freq: Four times a day (QID) | INTRAMUSCULAR | Status: DC | PRN

## 2015-01-18 MED ORDER — IOHEXOL 180 MG/ML  SOLN
15.0000 mL | Freq: Once | INTRAMUSCULAR | Status: AC | PRN
  Administered 2015-01-18: 15 mL via INTRATHECAL

## 2015-01-18 NOTE — Progress Notes (Signed)
When helping pt roll before CT, his side was scratched by my fingernail. Area cleaned on lower abd and ointment and bandaide applied.

## 2015-01-18 NOTE — Discharge Instructions (Signed)
[facility] Discharge Instructions  1. Go home and rest quietly for the next 24 hours.  It is important to lie flat for the next 24 hours.  Get up only to go to the restroom.  You may lie in the bed or on a couch on your back, your stomach, your left side or your right side.  You may have one pillow under your head.  You may have pillows between your knees while you are on your side or under your knees while you are on your back.  2. DO NOT drive today.  Recline the seat as far back as it will go, while still wearing your seat belt, on the way home.  3. You may get up to go to the bathroom as needed.  You may sit up for 10 minutes to eat.  You may resume your normal diet and medications unless otherwise indicated.  Drink plenty of extra fluids today and tomorrow.  4. The incidence of a spinal headache with nausea and/or vomiting is about 5% (one in 20 patients).  If you develop a headache, lie flat and drink plenty of fluids until the headache goes away.  Caffeinated beverages may be helpful.  If you develop severe nausea and vomiting or a headache that does not go away with flat bed rest, call [phone].  5. You may resume normal activities after your 24 hours of bed rest is over; however, do not exert yourself strongly or do any heavy lifting tomorrow.  6. Call your physician for a follow-up appointment.   You may resume Citalopram/Celexa on Saturday, January 19, 2015 after 2:00p.m.

## 2015-01-18 NOTE — Progress Notes (Signed)
Pt states he has been off Celexa for the past 2 days. Discharge instructions explained to pt.

## 2015-01-28 ENCOUNTER — Other Ambulatory Visit: Payer: Self-pay

## 2015-03-08 ENCOUNTER — Ambulatory Visit (HOSPITAL_COMMUNITY): Payer: Medicare Other | Attending: Cardiology

## 2015-03-08 ENCOUNTER — Other Ambulatory Visit: Payer: Self-pay

## 2015-03-08 ENCOUNTER — Other Ambulatory Visit: Payer: Self-pay | Admitting: Family Medicine

## 2015-03-08 DIAGNOSIS — I10 Essential (primary) hypertension: Secondary | ICD-10-CM | POA: Diagnosis not present

## 2015-03-08 DIAGNOSIS — E785 Hyperlipidemia, unspecified: Secondary | ICD-10-CM | POA: Diagnosis not present

## 2015-03-08 DIAGNOSIS — Z87891 Personal history of nicotine dependence: Secondary | ICD-10-CM | POA: Insufficient documentation

## 2015-03-08 DIAGNOSIS — R600 Localized edema: Secondary | ICD-10-CM

## 2015-03-08 DIAGNOSIS — E669 Obesity, unspecified: Secondary | ICD-10-CM | POA: Diagnosis not present

## 2015-03-13 ENCOUNTER — Encounter: Payer: Self-pay | Admitting: Physician Assistant

## 2015-03-13 DIAGNOSIS — M1712 Unilateral primary osteoarthritis, left knee: Secondary | ICD-10-CM | POA: Diagnosis present

## 2015-03-13 NOTE — H&P (Signed)
TOTAL KNEE ADMISSION H&P  Patient is being admitted for left total knee arthroplasty.  Subjective:  Chief Complaint:left knee pain.  HPI: Joe [name], 74 y.o. male, has a history of pain and functional disability in the left knee due to arthritis and has failed non-surgical conservative treatments for greater than 12 weeks to includeNSAID's and/or analgesics, corticosteriod injections, viscosupplementation injections, flexibility and strengthening excercises, use of assistive devices, weight reduction as appropriate and activity modification.  Onset of symptoms was gradual, starting 10 years ago with gradually worsening course since that time. The patient noted prior procedures on the knee to include  arthroscopy and menisectomy on the left knee(s).  Patient currently rates pain in the left knee(s) at 10 out of 10 with activity. Patient has night pain, worsening of pain with activity and weight bearing, pain that interferes with activities of daily living, crepitus and joint swelling.  Patient has evidence of subchondral sclerosis, periarticular osteophytes and joint space narrowing by imaging studies.  There is no active infection.  Patient Active Problem List   Diagnosis Date Noted  . Primary localized osteoarthritis of left knee   . DJD (degenerative joint disease) of knee 09/17/2014  . Primary localized osteoarthritis of right knee   . Sleep apnea   . Preop cardiovascular exam 06/07/2014  . RBBB 06/07/2014  . Benign paroxysmal positional vertigo 12/13/2012  . Syncope and collapse 12/11/2012  . HTN (hypertension) 12/11/2012  . HLD (hyperlipidemia) 12/11/2012  . Depression 12/11/2012  . Hypokalemia 12/11/2012   Past Medical History  Diagnosis Date  . Hypertension   . Syncope 12/11/2012  . Depression   . Hyperlipidemia   . Sleep apnea     CPAP  . Primary localized osteoarthritis of right knee   . Shortness of breath dyspnea   . Primary localized osteoarthritis of left knee      Past Surgical History  Procedure Laterality Date  . Eye surgery Left prosthesis  . Knee surgery Bilateral   . Rotator cuff repair    . 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  Current outpatient prescriptions:  .  amLODipine (NORVASC) 5 MG tablet, Take 5 mg by mouth daily., Disp: , Rfl: 6 .  apixaban (ELIQUIS) 2.5 MG TABS tablet, Take 1 tablet (2.5 mg total) by mouth 2 (two) times daily., Disp: 60 tablet, Rfl: 0 .  celecoxib (CELEBREX) 200 MG capsule, 1 tab po q day with food for pain and  swelling, Disp: 30 capsule, Rfl: 0 .  citalopram (CELEXA) 20 MG tablet, Take 20 mg by mouth daily., Disp: , Rfl:  .  docusate sodium 100 MG CAPS, Take 100 mg by mouth 2 (two) times daily., Disp: 60 capsule, Rfl: 0 .  lisinopril-hydrochlorothiazide (PRINZIDE,ZESTORETIC) 20-12.5 MG per tablet, Take 2 tablets by mouth daily., Disp: , Rfl:  .  loratadine (CLARITIN) 10 MG tablet, Take 1 tablet (10 mg total) by mouth daily. (Patient not taking: Reported on 09/04/2014), Disp: 30 tablet, Rfl: 2 .  oxyCODONE (OXY IR/ROXICODONE) 5 MG immediate release tablet, 1-2 tablets every 4-6 hrs as needed for pain, Disp: 100 tablet, Rfl: 0 .  OxyCODONE (OXYCONTIN) 20 mg T12A 12 hr tablet, Take 1 tablet (20 mg total) by  mouth every 12 (twelve) hours., Disp: 30 tablet, Rfl: 0 .  polyethylene glycol (MIRALAX / GLYCOLAX) packet, Take 17 g by mouth 2 (two) times daily., Disp: 60 each, Rfl: 0 .  simvastatin (ZOCOR) 20 MG tablet, Take 20 mg by mouth daily., Disp: , Rfl:  .  Tamsulosin HCl (FLOMAX) 0.4 MG CAPS, Take 0.4 mg by mouth daily., Disp: , Rfl:  Allergies  Allergen Reactions  . Demerol [Meperidine] Nausea And Vomiting and Rash  . Penicillins  Rash    History  Substance Use Topics  . Smoking status: Former Smoker    Quit date: 10/19/1996  . Smokeless tobacco: Never Used  . Alcohol Use: Yes     Comment: occassional    Family History  Problem Relation Age of Onset  . Diabetes Maternal Grandmother   . Heart attack Brother   . Heart attack Father   . Heart disease Mother   . Hypertension Mother   . Hypertension Father      Review of Systems  Constitutional: Negative.   HENT: Negative.   Eyes: Negative.   Respiratory: Negative.   Cardiovascular: Negative.   Gastrointestinal: Negative.   Genitourinary: Negative.   Musculoskeletal: Positive for back pain and joint pain.  Skin: Negative.   Neurological: Negative.   Endo/Heme/Allergies: Negative.   Psychiatric/Behavioral: Negative.     Objective:  Physical Exam  Constitutional: He is oriented to person, place, and time. He appears well-developed and well-nourished.  HENT:  Head: Normocephalic and atraumatic.  Mouth/Throat: Oropharynx is clear and moist.  Eyes: Conjunctivae and EOM are normal. Pupils are equal, round, and reactive to light.  Neck: Neck supple.  Cardiovascular: Normal rate and regular rhythm.   Respiratory: Effort normal and breath sounds normal.  GI: Soft. Bowel sounds are normal.  Genitourinary:  Not pertinent to current symptomatology therefore not examined.  Musculoskeletal:  Left knee has varus deformity.  Active range of motion 0-120 degrees.  Medial joint line tenderness.  2+ crepitus.  2+ synovitis.  Distal neurovascular exam is intact.  Right knee has well healed total knee incision.  Active range of motion is 0-120 degrees.  Minimal pain.  No swelling.  Distal neurovascular exam is intact.   Neurological: He is alert and oriented to person, place, and time.  Skin: Skin is warm and dry.  Psychiatric: He has a normal mood and affect.    Vital signs in last 24 hours: Temp:  [97.5 F (36.4 C)] 97.5 F (36.4 C) (05/25 1400) Pulse Rate:   [83] 83 (05/25 1400) BP: (146)/(73) 146/73 mmHg (05/25 1400) SpO2:  [97 %] 97 % (05/25 1400) Weight:  [120.657 kg (266 lb)] 120.657 kg (266 lb) (05/25 1400)  Labs:   Estimated body mass index is 41.65 kg/(m^2) as calculated from the following:   Height as of this encounter: 5\' 7"  (1.702 m).   Weight as of this encounter: 120.657 kg (266 lb).   Imaging Review Plain radiographs demonstrate severe degenerative joint disease of the left knee(s). The overall alignment issignificant varus. The bone quality appears to be good for age and reported activity level.  Assessment/Plan:  End stage arthritis, left knee  Principal Problem:   Primary localized osteoarthritis of left knee Active Problems:   HTN (hypertension)   HLD (hyperlipidemia)   Depression   Benign paroxysmal positional vertigo   RBBB   Sleep apnea   The patient history, physical examination, clinical judgment of the provider and imaging studies are consistent with end stage degenerative joint disease  of the left knee(s) and total knee arthroplasty is deemed medically necessary. The treatment options including medical management, injection therapy arthroscopy and arthroplasty were discussed at length. The risks and benefits of total knee arthroplasty were presented and reviewed. The risks due to aseptic loosening, infection, stiffness, patella tracking problems, thromboembolic complications and other imponderables were discussed. The patient acknowledged the explanation, agreed to proceed with the plan and consent was signed. Patient is being admitted for inpatient treatment for surgery, pain control, PT, OT, prophylactic antibiotics, VTE prophylaxis, progressive ambulation and ADL's and discharge planning. The patient is planning to be discharged home with home health services   Taylorann Tkach A. Kaleen Mask Physician Assistant [facility] [phone]  03/13/2015, 2:44 PM

## 2015-03-14 NOTE — Pre-Procedure Instructions (Signed)
Joe [name]  03/14/2015      [place], [address] [place] [address] Phone: [phone] Fax: [phone]  [id number] - [place], [place]. [place] [address] Phone: [phone] Fax: [phone]    Your procedure is scheduled on Mon, June 6 @ 12:40 PM  Report to [facility]  at 10:40 AM  Call this number if you have problems the morning of surgery:  [phone]   Remember:  Do not eat food or drink liquids after midnight.  Take these medicines the morning of surgery with A SIP OF WATER:Celexa(Citalopram) and Flomax(Tamsulosin)               Stop taking your Nabumetone. No Goody's,BC's,Aleve,Aspirin,Ibuprofen,Fish Oil,or any Herbal Medications.    Do not wear jewelry.  Do not wear lotions, powders, or colognes.  You may wear deodorant.  Men may shave face and neck.  Do not bring valuables to the hospital.  [facility] is not responsible for any belongings or valuables.  Contacts, dentures or bridgework may not be worn into surgery.  Leave your suitcase in the car.  After surgery it may be brought to your room.  For patients admitted to the hospital, discharge time will be determined by your treatment team.  Patients discharged the day of surgery will not be allowed to drive home.    Special instructions:  [facility] - Preparing for Surgery  Before surgery, you can play an important role.  Because skin is not sterile, your skin needs to be as free of germs as possible.  You can reduce the number of germs on you skin by washing with CHG (chlorahexidine gluconate) soap before surgery.  CHG is an antiseptic cleaner which kills germs and bonds with the skin to continue killing germs even after washing.  Please DO NOT use if you have an allergy to CHG or antibacterial soaps.  If your skin becomes reddened/irritated stop using the CHG and inform  your nurse when you arrive at Short Stay.  Do not shave (including legs and underarms) for at least 48 hours prior to the first CHG shower.  You may shave your face.  Please follow these instructions carefully:   1.  Shower with CHG Soap the night before surgery and the                                morning of Surgery.  2.  If you choose to wash your hair, wash your hair first as usual with your       normal shampoo.  3.  After you shampoo, rinse your hair and body thoroughly to remove the                      Shampoo.  4.  Use CHG as you would any other liquid soap.  You can apply chg directly       to the skin and wash gently with scrungie or a clean washcloth.  5.  Apply the CHG Soap to your body ONLY FROM THE NECK DOWN.        Do not use on open wounds or open sores.  Avoid contact with your eyes,       ears, mouth and genitals (private parts).  Wash genitals (private parts)  with your normal soap.  6.  Wash thoroughly, paying special attention to the area where your surgery        will be performed.  7.  Thoroughly rinse your body with warm water from the neck down.  8.  DO NOT shower/wash with your normal soap after using and rinsing off       the CHG Soap.  9.  Pat yourself dry with a clean towel.            10.  Wear clean pajamas.            11.  Place clean sheets on your bed the night of your first shower and do not        sleep with pets.  Day of Surgery  Do not apply any lotions/deoderants the morning of surgery.  Please wear clean clothes to the hospital/surgery center.    Please read over the following fact sheets that you were given. Pain Booklet, Coughing and Deep Breathing, Blood Transfusion Information, MRSA Information and Surgical Site Infection Prevention

## 2015-03-15 ENCOUNTER — Encounter (HOSPITAL_COMMUNITY)
Admission: RE | Admit: 2015-03-15 | Discharge: 2015-03-15 | Disposition: A | Discharge status: Home / Self Care | Payer: Medicare Other | Source: Ambulatory Visit | Attending: Orthopedic Surgery | Admitting: Orthopedic Surgery

## 2015-03-15 ENCOUNTER — Encounter (HOSPITAL_COMMUNITY): Payer: Self-pay

## 2015-03-15 DIAGNOSIS — Z01818 Encounter for other preprocedural examination: Secondary | ICD-10-CM | POA: Insufficient documentation

## 2015-03-15 LAB — URINALYSIS, ROUTINE W REFLEX MICROSCOPIC
Bilirubin Urine: NEGATIVE
Glucose, UA: NEGATIVE mg/dL
HGB URINE DIPSTICK: NEGATIVE
KETONES UR: NEGATIVE mg/dL
LEUKOCYTES UA: NEGATIVE
Nitrite: NEGATIVE
Protein, ur: NEGATIVE mg/dL
Specific Gravity, Urine: 1.026 (ref 1.005–1.030)
UROBILINOGEN UA: 1 mg/dL (ref 0.0–1.0)
pH: 7 (ref 5.0–8.0)

## 2015-03-15 LAB — CBC WITH DIFFERENTIAL/PLATELET
Basophils Absolute: 0.1 10*3/uL (ref 0.0–0.1)
Basophils Relative: 1 % (ref 0–1)
EOS PCT: 6 % — AB (ref 0–5)
Eosinophils Absolute: 0.4 10*3/uL (ref 0.0–0.7)
HEMATOCRIT: 40.6 % (ref 39.0–52.0)
Hemoglobin: 13.1 g/dL (ref 13.0–17.0)
LYMPHS ABS: 1.3 10*3/uL (ref 0.7–4.0)
LYMPHS PCT: 18 % (ref 12–46)
MCH: 25.8 pg — ABNORMAL LOW (ref 26.0–34.0)
MCHC: 32.3 g/dL (ref 30.0–36.0)
MCV: 79.9 fL (ref 78.0–100.0)
Monocytes Absolute: 0.7 10*3/uL (ref 0.1–1.0)
Monocytes Relative: 9 % (ref 3–12)
NEUTROS ABS: 4.7 10*3/uL (ref 1.7–7.7)
Neutrophils Relative %: 66 % (ref 43–77)
PLATELETS: 186 10*3/uL (ref 150–400)
RBC: 5.08 MIL/uL (ref 4.22–5.81)
RDW: 15.5 % (ref 11.5–15.5)
WBC: 7.1 10*3/uL (ref 4.0–10.5)

## 2015-03-15 LAB — TYPE AND SCREEN
ABO/RH(D): O POS
Antibody Screen: NEGATIVE

## 2015-03-15 LAB — COMPREHENSIVE METABOLIC PANEL
ALT: 26 U/L (ref 17–63)
AST: 25 U/L (ref 15–41)
Albumin: 3.6 g/dL (ref 3.5–5.0)
Alkaline Phosphatase: 77 U/L (ref 38–126)
Anion gap: 7 (ref 5–15)
BILIRUBIN TOTAL: 1.2 mg/dL (ref 0.3–1.2)
BUN: 20 mg/dL (ref 6–20)
CALCIUM: 8.9 mg/dL (ref 8.9–10.3)
CO2: 28 mmol/L (ref 22–32)
CREATININE: 0.88 mg/dL (ref 0.61–1.24)
Chloride: 104 mmol/L (ref 101–111)
GFR calc Af Amer: >60 mL/min (ref >60)
Glucose, Bld: 154 mg/dL — ABNORMAL HIGH (ref 65–99)
POTASSIUM: 3.4 mmol/L — AB (ref 3.5–5.1)
SODIUM: 139 mmol/L (ref 135–145)
TOTAL PROTEIN: 6.7 g/dL (ref 6.5–8.1)

## 2015-03-15 LAB — APTT: APTT: 30 s (ref 24–37)

## 2015-03-15 LAB — SURGICAL PCR SCREEN
MRSA, PCR: NEGATIVE
Staphylococcus aureus: NEGATIVE

## 2015-03-15 LAB — PROTIME-INR
INR: 1.16 (ref 0.00–1.49)
Prothrombin Time: 15 seconds (ref 11.6–15.2)

## 2015-03-15 NOTE — Progress Notes (Signed)
Requested last office notes from Dr. Serita Grammes [facility]).

## 2015-03-16 LAB — URINE CULTURE
Colony Count: NO GROWTH
Culture: NO GROWTH

## 2015-03-18 NOTE — Progress Notes (Signed)
Anesthesia Chart Review:  Pt is 74 year old male scheduled for L total knee arthroplasty on 03/25/2015 with Dr. Noemi Chapel.   PMH includes: HTN, OSA, hyperlipidemia, syncope 11/2012 with negative cardiac work up. Former smoker. BMI 42. 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   Preoperative labs reviewed.    Chest x-ray 09/10/2014 reviewed. No active cardiopulmonary disease.   EKG 8//20/2015: NSR. LAD. Incomplete RBBB.   Echo 12/12/2012: - Left ventricle: The cavity size was normal. There was mild concentric hypertrophy. Systolic function was normal. The estimated ejection fraction was in the range of 60% to 65%. Wall motion was normal; there were no regional wall motion abnormalities. - Left atrium: The atrium was mildly dilated. - Pulmonary arteries: Systolic pressure was mildly increased. PA peak pressure: 32mm Hg (S).  As pt tolerated TKA 7 months ago without issue, I anticipate he can proceed as scheduled.   Willeen Cass, FNP-BC [facility]/Anesthesiology Phone: [phone] 03/18/2015 10:01 AM

## 2015-03-24 MED ORDER — VANCOMYCIN HCL 10 G IV SOLR
1500.0000 mg | INTRAVENOUS | Status: AC
  Administered 2015-03-25 (×2): 1500 mg via INTRAVENOUS
  Filled 2015-03-24: qty 1500

## 2015-03-25 ENCOUNTER — Inpatient Hospital Stay (HOSPITAL_COMMUNITY): Payer: Medicare Other | Admitting: Emergency Medicine

## 2015-03-25 ENCOUNTER — Encounter (HOSPITAL_COMMUNITY): Payer: Self-pay | Admitting: General Practice

## 2015-03-25 ENCOUNTER — Inpatient Hospital Stay (HOSPITAL_COMMUNITY): Payer: Medicare Other | Admitting: Anesthesiology

## 2015-03-25 ENCOUNTER — Inpatient Hospital Stay (HOSPITAL_COMMUNITY)
Admission: RE | Admit: 2015-03-25 | Discharge: 2015-03-27 | DRG: 470 | Disposition: A | Discharge status: Home Health | Payer: Medicare Other | Source: Ambulatory Visit | Attending: Orthopedic Surgery | Admitting: Orthopedic Surgery

## 2015-03-25 ENCOUNTER — Encounter (HOSPITAL_COMMUNITY)
Admission: RE | Disposition: A | Discharge status: Home Health | Payer: Self-pay | Source: Ambulatory Visit | Attending: Orthopedic Surgery

## 2015-03-25 DIAGNOSIS — Z8249 Family history of ischemic heart disease and other diseases of the circulatory system: Secondary | ICD-10-CM

## 2015-03-25 DIAGNOSIS — M25562 Pain in left knee: Secondary | ICD-10-CM | POA: Diagnosis present

## 2015-03-25 DIAGNOSIS — D72829 Elevated white blood cell count, unspecified: Secondary | ICD-10-CM

## 2015-03-25 DIAGNOSIS — H8110 Benign paroxysmal vertigo, unspecified ear: Secondary | ICD-10-CM | POA: Diagnosis present

## 2015-03-25 DIAGNOSIS — Z87891 Personal history of nicotine dependence: Secondary | ICD-10-CM

## 2015-03-25 DIAGNOSIS — E785 Hyperlipidemia, unspecified: Secondary | ICD-10-CM | POA: Diagnosis present

## 2015-03-25 DIAGNOSIS — I10 Essential (primary) hypertension: Secondary | ICD-10-CM | POA: Diagnosis present

## 2015-03-25 DIAGNOSIS — F329 Major depressive disorder, single episode, unspecified: Secondary | ICD-10-CM | POA: Diagnosis present

## 2015-03-25 DIAGNOSIS — Z833 Family history of diabetes mellitus: Secondary | ICD-10-CM | POA: Diagnosis not present

## 2015-03-25 DIAGNOSIS — G4730 Sleep apnea, unspecified: Secondary | ICD-10-CM | POA: Diagnosis present

## 2015-03-25 DIAGNOSIS — Z7982 Long term (current) use of aspirin: Secondary | ICD-10-CM

## 2015-03-25 DIAGNOSIS — Z79899 Other long term (current) drug therapy: Secondary | ICD-10-CM | POA: Diagnosis not present

## 2015-03-25 DIAGNOSIS — M1712 Unilateral primary osteoarthritis, left knee: Secondary | ICD-10-CM | POA: Diagnosis present

## 2015-03-25 DIAGNOSIS — Z6841 Body Mass Index (BMI) 40.0 and over, adult: Secondary | ICD-10-CM

## 2015-03-25 DIAGNOSIS — I4510 Unspecified right bundle-branch block: Secondary | ICD-10-CM | POA: Diagnosis present

## 2015-03-25 DIAGNOSIS — Z96651 Presence of right artificial knee joint: Secondary | ICD-10-CM | POA: Diagnosis present

## 2015-03-25 DIAGNOSIS — M1710 Unilateral primary osteoarthritis, unspecified knee: Secondary | ICD-10-CM | POA: Diagnosis present

## 2015-03-25 DIAGNOSIS — F32A Depression, unspecified: Secondary | ICD-10-CM | POA: Diagnosis present

## 2015-03-25 DIAGNOSIS — M179 Osteoarthritis of knee, unspecified: Secondary | ICD-10-CM | POA: Diagnosis present

## 2015-03-25 HISTORY — DX: Unilateral primary osteoarthritis, left knee: M17.12

## 2015-03-25 HISTORY — PX: TOTAL KNEE ARTHROPLASTY: SHX125

## 2015-03-25 SURGERY — ARTHROPLASTY, KNEE, TOTAL
Anesthesia: Regional | Site: Knee | Laterality: Left

## 2015-03-25 MED ORDER — HYDROMORPHONE HCL 1 MG/ML IJ SOLN
INTRAMUSCULAR | Status: AC
  Administered 2015-03-25: 0.5 mg via INTRAVENOUS
  Filled 2015-03-25: qty 2

## 2015-03-25 MED ORDER — CITALOPRAM HYDROBROMIDE 20 MG PO TABS
20.0000 mg | ORAL_TABLET | Freq: Every day | ORAL | Status: DC
  Administered 2015-03-26 – 2015-03-27 (×2): 20 mg via ORAL
  Filled 2015-03-25 (×2): qty 1

## 2015-03-25 MED ORDER — LISINOPRIL-HYDROCHLOROTHIAZIDE 20-12.5 MG PO TABS: 1.0000 | ORAL_TABLET | Freq: Two times a day (BID) | ORAL | Status: DC

## 2015-03-25 MED ORDER — POVIDONE-IODINE 7.5 % EX SOLN
Freq: Once | CUTANEOUS | Status: DC
  Filled 2015-03-25: qty 118

## 2015-03-25 MED ORDER — CHLORHEXIDINE GLUCONATE 4 % EX LIQD: 60.0000 mL | Freq: Once | CUTANEOUS | Status: DC

## 2015-03-25 MED ORDER — FENTANYL CITRATE (PF) 100 MCG/2ML IJ SOLN
50.0000 ug | INTRAMUSCULAR | Status: DC | PRN
  Administered 2015-03-25: 50 ug via INTRAVENOUS

## 2015-03-25 MED ORDER — MIDAZOLAM HCL 2 MG/2ML IJ SOLN
1.0000 mg | INTRAMUSCULAR | Status: DC | PRN
  Administered 2015-03-25: 1 mg via INTRAVENOUS

## 2015-03-25 MED ORDER — HYDROMORPHONE HCL 1 MG/ML IJ SOLN
0.2500 mg | INTRAMUSCULAR | Status: DC | PRN
  Administered 2015-03-25 (×4): 0.5 mg via INTRAVENOUS

## 2015-03-25 MED ORDER — LIDOCAINE HCL (CARDIAC) 20 MG/ML IV SOLN
INTRAVENOUS | Status: DC | PRN
  Administered 2015-03-25: 100 mg via INTRAVENOUS

## 2015-03-25 MED ORDER — POLYETHYLENE GLYCOL 3350 17 G PO PACK
17.0000 g | PACK | Freq: Two times a day (BID) | ORAL | Status: DC
  Administered 2015-03-25 – 2015-03-27 (×3): 17 g via ORAL
  Filled 2015-03-25 (×4): qty 1

## 2015-03-25 MED ORDER — OXYCODONE HCL 5 MG PO TABS
5.0000 mg | ORAL_TABLET | ORAL | Status: DC | PRN
  Administered 2015-03-25: 10 mg via ORAL
  Administered 2015-03-26 (×2): 5 mg via ORAL
  Administered 2015-03-26 – 2015-03-27 (×6): 10 mg via ORAL
  Filled 2015-03-25 (×6): qty 2
  Filled 2015-03-25: qty 1
  Filled 2015-03-25: qty 2

## 2015-03-25 MED ORDER — GLYCOPYRROLATE 0.2 MG/ML IJ SOLN
INTRAMUSCULAR | Status: DC | PRN
  Administered 2015-03-25: .4 mg via INTRAVENOUS

## 2015-03-25 MED ORDER — OXYCODONE HCL 5 MG PO TABS
ORAL_TABLET | ORAL | Status: AC
  Administered 2015-03-25: 10 mg via ORAL
  Filled 2015-03-25: qty 2

## 2015-03-25 MED ORDER — PROPOFOL 10 MG/ML IV BOLUS
INTRAVENOUS | Status: AC
  Filled 2015-03-25: qty 20

## 2015-03-25 MED ORDER — HYDROMORPHONE HCL 1 MG/ML IJ SOLN
INTRAMUSCULAR | Status: AC
  Filled 2015-03-25: qty 1

## 2015-03-25 MED ORDER — SUFENTANIL CITRATE 50 MCG/ML IV SOLN
INTRAVENOUS | Status: DC | PRN
  Administered 2015-03-25: 10 ug via INTRAVENOUS
  Administered 2015-03-25: 5 ug via INTRAVENOUS
  Administered 2015-03-25: 20 ug via INTRAVENOUS

## 2015-03-25 MED ORDER — BUPIVACAINE-EPINEPHRINE 0.25% -1:200000 IJ SOLN
INTRAMUSCULAR | Status: DC | PRN
  Administered 2015-03-25: 30 mL

## 2015-03-25 MED ORDER — FENTANYL CITRATE (PF) 100 MCG/2ML IJ SOLN
INTRAMUSCULAR | Status: AC
  Filled 2015-03-25: qty 2

## 2015-03-25 MED ORDER — 0.9 % SODIUM CHLORIDE (POUR BTL) OPTIME
TOPICAL | Status: DC | PRN
  Administered 2015-03-25: 1000 mL

## 2015-03-25 MED ORDER — SUFENTANIL CITRATE 50 MCG/ML IV SOLN
INTRAVENOUS | Status: AC
  Filled 2015-03-25: qty 1

## 2015-03-25 MED ORDER — BUPIVACAINE-EPINEPHRINE (PF) 0.5% -1:200000 IJ SOLN
INTRAMUSCULAR | Status: DC | PRN
  Administered 2015-03-25: 30 mL via PERINEURAL

## 2015-03-25 MED ORDER — MENTHOL 3 MG MT LOZG: 1.0000 | LOZENGE | OROMUCOSAL | Status: DC | PRN

## 2015-03-25 MED ORDER — ONDANSETRON HCL 4 MG PO TABS: 4.0000 mg | ORAL_TABLET | Freq: Four times a day (QID) | ORAL | Status: DC | PRN

## 2015-03-25 MED ORDER — VERAPAMIL HCL ER 120 MG PO TBCR
120.0000 mg | EXTENDED_RELEASE_TABLET | Freq: Every day | ORAL | Status: DC
  Administered 2015-03-26 – 2015-03-27 (×2): 120 mg via ORAL
  Filled 2015-03-25 (×2): qty 1

## 2015-03-25 MED ORDER — DIPHENHYDRAMINE HCL 12.5 MG/5ML PO ELIX: 12.5000 mg | ORAL_SOLUTION | ORAL | Status: DC | PRN

## 2015-03-25 MED ORDER — ONDANSETRON HCL 4 MG/2ML IJ SOLN: 4.0000 mg | Freq: Four times a day (QID) | INTRAMUSCULAR | Status: DC | PRN

## 2015-03-25 MED ORDER — DOCUSATE SODIUM 100 MG PO CAPS
100.0000 mg | ORAL_CAPSULE | Freq: Two times a day (BID) | ORAL | Status: DC
  Administered 2015-03-25 – 2015-03-27 (×3): 100 mg via ORAL
  Filled 2015-03-25 (×4): qty 1

## 2015-03-25 MED ORDER — SODIUM CHLORIDE 0.9 % IR SOLN
Status: DC | PRN
  Administered 2015-03-25: 3000 mL

## 2015-03-25 MED ORDER — HYDROMORPHONE HCL 1 MG/ML IJ SOLN
1.0000 mg | INTRAMUSCULAR | Status: DC | PRN
  Administered 2015-03-25 (×2): 1 mg via INTRAVENOUS
  Filled 2015-03-25: qty 1

## 2015-03-25 MED ORDER — OXYCODONE HCL 5 MG PO TABS: 5.0000 mg | ORAL_TABLET | Freq: Once | ORAL | Status: DC | PRN

## 2015-03-25 MED ORDER — APIXABAN 2.5 MG PO TABS
2.5000 mg | ORAL_TABLET | Freq: Two times a day (BID) | ORAL | Status: DC
  Administered 2015-03-26 – 2015-03-27 (×3): 2.5 mg via ORAL
  Filled 2015-03-25 (×3): qty 1

## 2015-03-25 MED ORDER — PROPOFOL 10 MG/ML IV BOLUS
INTRAVENOUS | Status: DC | PRN
  Administered 2015-03-25: 20 mg via INTRAVENOUS
  Administered 2015-03-25: 40 mg via INTRAVENOUS
  Administered 2015-03-25: 160 mg via INTRAVENOUS

## 2015-03-25 MED ORDER — ACETAMINOPHEN 650 MG RE SUPP: 650.0000 mg | Freq: Four times a day (QID) | RECTAL | Status: DC | PRN

## 2015-03-25 MED ORDER — MIDAZOLAM HCL 2 MG/2ML IJ SOLN
INTRAMUSCULAR | Status: AC
  Filled 2015-03-25: qty 2

## 2015-03-25 MED ORDER — ROCURONIUM BROMIDE 100 MG/10ML IV SOLN
INTRAVENOUS | Status: DC | PRN
  Administered 2015-03-25: 50 mg via INTRAVENOUS

## 2015-03-25 MED ORDER — HYDROCHLOROTHIAZIDE 12.5 MG PO CAPS
12.5000 mg | ORAL_CAPSULE | Freq: Every day | ORAL | Status: DC
  Administered 2015-03-26 – 2015-03-27 (×2): 12.5 mg via ORAL
  Filled 2015-03-25 (×4): qty 1

## 2015-03-25 MED ORDER — TAMSULOSIN HCL 0.4 MG PO CAPS
0.4000 mg | ORAL_CAPSULE | Freq: Every day | ORAL | Status: DC
  Administered 2015-03-26 – 2015-03-27 (×2): 0.4 mg via ORAL
  Filled 2015-03-25 (×2): qty 1

## 2015-03-25 MED ORDER — VERAPAMIL HCL 120 MG PO TABS: 120.0000 mg | ORAL_TABLET | Freq: Every day | ORAL | Status: DC

## 2015-03-25 MED ORDER — METOCLOPRAMIDE HCL 5 MG PO TABS: 5.0000 mg | ORAL_TABLET | Freq: Three times a day (TID) | ORAL | Status: DC | PRN

## 2015-03-25 MED ORDER — LACTATED RINGERS IV SOLN
INTRAVENOUS | Status: DC
  Administered 2015-03-25 (×3): via INTRAVENOUS

## 2015-03-25 MED ORDER — BUPIVACAINE-EPINEPHRINE (PF) 0.25% -1:200000 IJ SOLN
INTRAMUSCULAR | Status: AC
  Filled 2015-03-25: qty 30

## 2015-03-25 MED ORDER — SIMVASTATIN 20 MG PO TABS: 20.0000 mg | ORAL_TABLET | Freq: Every day | ORAL | Status: DC

## 2015-03-25 MED ORDER — ALUM & MAG HYDROXIDE-SIMETH 200-200-20 MG/5ML PO SUSP: 30.0000 mL | ORAL | Status: DC | PRN

## 2015-03-25 MED ORDER — DEXAMETHASONE SODIUM PHOSPHATE 10 MG/ML IJ SOLN
INTRAMUSCULAR | Status: AC
  Filled 2015-03-25: qty 1

## 2015-03-25 MED ORDER — FUROSEMIDE 40 MG PO TABS
40.0000 mg | ORAL_TABLET | Freq: Every day | ORAL | Status: DC
  Administered 2015-03-27: 40 mg via ORAL
  Filled 2015-03-25: qty 1

## 2015-03-25 MED ORDER — DEXAMETHASONE SODIUM PHOSPHATE 10 MG/ML IJ SOLN
INTRAMUSCULAR | Status: DC | PRN
  Administered 2015-03-25: 10 mg via INTRAVENOUS

## 2015-03-25 MED ORDER — NEOSTIGMINE METHYLSULFATE 10 MG/10ML IV SOLN
INTRAVENOUS | Status: DC | PRN
  Administered 2015-03-25: 3 mg via INTRAVENOUS

## 2015-03-25 MED ORDER — ACETAMINOPHEN 325 MG PO TABS
ORAL_TABLET | ORAL | Status: AC
  Administered 2015-03-25: 650 mg via ORAL
  Filled 2015-03-25: qty 2

## 2015-03-25 MED ORDER — LIDOCAINE HCL (CARDIAC) 20 MG/ML IV SOLN
INTRAVENOUS | Status: AC
  Filled 2015-03-25: qty 5

## 2015-03-25 MED ORDER — DEXAMETHASONE SODIUM PHOSPHATE 10 MG/ML IJ SOLN
10.0000 mg | Freq: Three times a day (TID) | INTRAMUSCULAR | Status: DC
  Administered 2015-03-25 – 2015-03-27 (×6): 10 mg via INTRAVENOUS
  Filled 2015-03-25 (×5): qty 1

## 2015-03-25 MED ORDER — SODIUM CHLORIDE 0.9 % IJ SOLN
INTRAMUSCULAR | Status: AC
  Filled 2015-03-25: qty 10

## 2015-03-25 MED ORDER — TRAZODONE HCL 50 MG PO TABS
50.0000 mg | ORAL_TABLET | Freq: Every day | ORAL | Status: DC
  Administered 2015-03-25 – 2015-03-26 (×2): 50 mg via ORAL
  Filled 2015-03-25 (×2): qty 1

## 2015-03-25 MED ORDER — PHENOL 1.4 % MT LIQD: 1.0000 | OROMUCOSAL | Status: DC | PRN

## 2015-03-25 MED ORDER — ACETAMINOPHEN 325 MG PO TABS
650.0000 mg | ORAL_TABLET | Freq: Four times a day (QID) | ORAL | Status: DC | PRN
  Administered 2015-03-25: 650 mg via ORAL

## 2015-03-25 MED ORDER — OXYCODONE HCL 5 MG/5ML PO SOLN: 5.0000 mg | Freq: Once | ORAL | Status: DC | PRN

## 2015-03-25 MED ORDER — POTASSIUM CHLORIDE IN NACL 20-0.9 MEQ/L-% IV SOLN
INTRAVENOUS | Status: DC
  Administered 2015-03-25: 18:00:00 via INTRAVENOUS
  Administered 2015-03-26: 100 mL/h via INTRAVENOUS
  Filled 2015-03-25 (×7): qty 1000

## 2015-03-25 MED ORDER — METOCLOPRAMIDE HCL 5 MG/ML IJ SOLN: 5.0000 mg | Freq: Three times a day (TID) | INTRAMUSCULAR | Status: DC | PRN

## 2015-03-25 MED ORDER — ATORVASTATIN CALCIUM 10 MG PO TABS
10.0000 mg | ORAL_TABLET | Freq: Every day | ORAL | Status: DC
  Administered 2015-03-25 – 2015-03-26 (×2): 10 mg via ORAL
  Filled 2015-03-25 (×2): qty 1

## 2015-03-25 MED ORDER — LISINOPRIL 20 MG PO TABS
20.0000 mg | ORAL_TABLET | Freq: Every day | ORAL | Status: DC
  Administered 2015-03-26 – 2015-03-27 (×2): 20 mg via ORAL
  Filled 2015-03-25 (×2): qty 1

## 2015-03-25 SURGICAL SUPPLY — 73 items
APL SKNCLS STERI-STRIP NONHPOA (GAUZE/BANDAGES/DRESSINGS) ×1
BANDAGE ESMARK 6X9 LF (GAUZE/BANDAGES/DRESSINGS) ×1 IMPLANT
BENZOIN TINCTURE PRP APPL 2/3 (GAUZE/BANDAGES/DRESSINGS) ×2 IMPLANT
BLADE SAGITTAL 25.0X1.19X90 (BLADE) ×2 IMPLANT
BLADE SAW SGTL 11.0X1.19X90.0M (BLADE) IMPLANT
BLADE SAW SGTL 13.0X1.19X90.0M (BLADE) ×2 IMPLANT
BLADE SURG 10 STRL SS (BLADE) ×4 IMPLANT
BNDG CMPR 9X6 STRL LF SNTH (GAUZE/BANDAGES/DRESSINGS)
BNDG CMPR MED 15X6 ELC VLCR LF (GAUZE/BANDAGES/DRESSINGS) ×1
BNDG ELASTIC 6X15 VLCR STRL LF (GAUZE/BANDAGES/DRESSINGS) ×2 IMPLANT
BNDG ESMARK 6X9 LF (GAUZE/BANDAGES/DRESSINGS)
BOWL SMART MIX CTS (DISPOSABLE) ×2 IMPLANT
CAP KNEE TOTAL 3 SIGMA ×1 IMPLANT
CEMENT HV SMART SET (Cement) ×4 IMPLANT
CLSR STERI-STRIP ANTIMIC 1/2X4 (GAUZE/BANDAGES/DRESSINGS) ×1 IMPLANT
COVER SURGICAL LIGHT HANDLE (MISCELLANEOUS) ×2 IMPLANT
CUFF TOURNIQUET SINGLE 34IN LL (TOURNIQUET CUFF) ×2 IMPLANT
CUFF TOURNIQUET SINGLE 44IN (TOURNIQUET CUFF) IMPLANT
DRAPE EXTREMITY T 121X128X90 (DRAPE) ×2 IMPLANT
DRAPE IMP U-DRAPE 54X76 (DRAPES) ×2 IMPLANT
DRAPE INCISE IOBAN 66X45 STRL (DRAPES) ×2 IMPLANT
DRAPE PROXIMA HALF (DRAPES) ×2 IMPLANT
DRAPE U-SHAPE 47X51 STRL (DRAPES) ×2 IMPLANT
DRSG AQUACEL AG ADV 3.5X14 (GAUZE/BANDAGES/DRESSINGS) ×2 IMPLANT
DRSG PAD ABDOMINAL 8X10 ST (GAUZE/BANDAGES/DRESSINGS) ×3 IMPLANT
DURAPREP 26ML APPLICATOR (WOUND CARE) ×4 IMPLANT
ELECT CAUTERY BLADE 6.4 (BLADE) ×2 IMPLANT
ELECT REM PT RETURN 9FT ADLT (ELECTROSURGICAL) ×2
ELECTRODE REM PT RTRN 9FT ADLT (ELECTROSURGICAL) ×1 IMPLANT
EVACUATOR 1/8 PVC DRAIN (DRAIN) ×2 IMPLANT
FACESHIELD WRAPAROUND (MASK) IMPLANT
FACESHIELD WRAPAROUND OR TEAM (MASK) ×1 IMPLANT
GAUZE SPONGE 4X4 12PLY STRL (GAUZE/BANDAGES/DRESSINGS) ×2 IMPLANT
GLOVE BIO SURGEON STRL SZ7 (GLOVE) ×2 IMPLANT
GLOVE BIOGEL PI IND STRL 7.0 (GLOVE) ×1 IMPLANT
GLOVE BIOGEL PI IND STRL 7.5 (GLOVE) ×1 IMPLANT
GLOVE BIOGEL PI INDICATOR 7.0 (GLOVE) ×1
GLOVE BIOGEL PI INDICATOR 7.5 (GLOVE) ×1
GLOVE SS BIOGEL STRL SZ 7.5 (GLOVE) ×1 IMPLANT
GLOVE SUPERSENSE BIOGEL SZ 7.5 (GLOVE) ×1
GOWN STRL REUS W/ TWL LRG LVL3 (GOWN DISPOSABLE) ×2 IMPLANT
GOWN STRL REUS W/ TWL XL LVL3 (GOWN DISPOSABLE) ×2 IMPLANT
GOWN STRL REUS W/TWL LRG LVL3 (GOWN DISPOSABLE) ×4
GOWN STRL REUS W/TWL XL LVL3 (GOWN DISPOSABLE) ×4
HANDPIECE INTERPULSE COAX TIP (DISPOSABLE) ×2
HOOD PEEL AWAY FACE SHEILD DIS (HOOD) ×4 IMPLANT
IMMOBILIZER KNEE 22 UNIV (SOFTGOODS) ×1 IMPLANT
KIT BASIN OR (CUSTOM PROCEDURE TRAY) ×2 IMPLANT
KIT ROOM TURNOVER OR (KITS) ×2 IMPLANT
MANIFOLD NEPTUNE II (INSTRUMENTS) ×2 IMPLANT
MARKER SKIN DUAL TIP RULER LAB (MISCELLANEOUS) ×2 IMPLANT
NS IRRIG 1000ML POUR BTL (IV SOLUTION) ×2 IMPLANT
PACK TOTAL JOINT (CUSTOM PROCEDURE TRAY) ×2 IMPLANT
PACK UNIVERSAL I (CUSTOM PROCEDURE TRAY) ×2 IMPLANT
PAD ARMBOARD 7.5X6 YLW CONV (MISCELLANEOUS) ×4 IMPLANT
PAD CAST 4YDX4 CTTN HI CHSV (CAST SUPPLIES) IMPLANT
PADDING CAST COTTON 4X4 STRL (CAST SUPPLIES) ×2
PADDING CAST COTTON 6X4 STRL (CAST SUPPLIES) ×2 IMPLANT
RUBBERBAND STERILE (MISCELLANEOUS) ×2 IMPLANT
SET HNDPC FAN SPRY TIP SCT (DISPOSABLE) ×1 IMPLANT
STRIP CLOSURE SKIN 1/2X4 (GAUZE/BANDAGES/DRESSINGS) ×1 IMPLANT
SUCTION FRAZIER TIP 10 FR DISP (SUCTIONS) ×2 IMPLANT
SUT ETHIBOND NAB CT1 #1 30IN (SUTURE) ×4 IMPLANT
SUT MNCRL AB 3-0 PS2 18 (SUTURE) ×2 IMPLANT
SUT VIC AB 0 CT1 27 (SUTURE) ×4
SUT VIC AB 0 CT1 27XBRD ANBCTR (SUTURE) ×2 IMPLANT
SUT VIC AB 2-0 CT1 27 (SUTURE) ×4
SUT VIC AB 2-0 CT1 TAPERPNT 27 (SUTURE) ×2 IMPLANT
SYR 30ML SLIP (SYRINGE) ×2 IMPLANT
TOWEL OR 17X24 6PK STRL BLUE (TOWEL DISPOSABLE) ×2 IMPLANT
TOWEL OR 17X26 10 PK STRL BLUE (TOWEL DISPOSABLE) ×2 IMPLANT
TRAY FOLEY CATH 16FR SILVER (SET/KITS/TRAYS/PACK) ×2 IMPLANT
WATER STERILE IRR 1000ML POUR (IV SOLUTION) ×2 IMPLANT

## 2015-03-25 NOTE — Progress Notes (Signed)
Orthopedic Tech Progress Note Patient Details:  Joe [name] 04/02/1941 [id number]  CPM Left Knee CPM Left Knee: On Left Knee Flexion (Degrees): 90 Left Knee Extension (Degrees): 0 Additional Comments: trapeze bar patient helper Viewed order from doctor's order list  Hildred Priest 03/25/2015, 1:49 PM

## 2015-03-25 NOTE — Progress Notes (Signed)
Orthopedic Tech Progress Note Patient Details:  [name] 1941-09-28 [id number] On cpm at 7:00pm Patient ID: [name], male   DOB: [birth year], 74 y.o.   MRN: [id number]   Braulio Bosch 03/25/2015, 7:02 PM

## 2015-03-25 NOTE — Op Note (Signed)
MRN:     [id number] DOB/AGE:    10/30/1940 / 74 y.o.       OPERATIVE REPORT    DATE OF PROCEDURE:  03/25/2015       PREOPERATIVE DIAGNOSIS:   PRIMARY LOCALIZED OA LEFT KNEE      Estimated body mass index is 41.81 kg/(m^2) as calculated from the following:   Height as of this encounter: 5\' 7"  (1.702 m).   Weight as of this encounter: 121.11 kg (267 lb).                                                        POSTOPERATIVE DIAGNOSIS:   SAME                                                                    PROCEDURE:  Procedure(s): TOTAL KNEE ARTHROPLASTY Using Depuy Sigma RP implants #4 narrow Femur, #4Tibia, 45mm sigma RP bearing, 32 Patella     SURGEON: Mouna Yager A    ASSISTANT:  Kirstin Shepperson PA-C   (Present and scrubbed throughout the case, critical for assistance with exposure, retraction, instrumentation, and closure.)         ANESTHESIA: GET with Femoral Nerve Block  DRAINS: foley, 2 medium hemovac in knee   TOURNIQUET TIME: 09BDZ   COMPLICATIONS:  None     SPECIMENS: None   INDICATIONS FOR PROCEDURE: The patient has  DJD LEFT KNEE, varus deformities, XR shows bone on bone arthritis. Patient has failed all conservative measures including anti-inflammatory medicines, narcotics, attempts at  exercise and weight loss, cortisone injections and viscosupplementation.  Risks and benefits of surgery have been discussed, questions answered.   DESCRIPTION OF PROCEDURE: The patient identified by armband, received  right femoral nerve block and IV antibiotics, in the holding area at [facility]. Patient taken to the operating room, appropriate anesthetic  monitors were attached General endotracheal anesthesia induced with  the patient in supine position, Foley catheter was inserted. Tourniquet  applied high to the operative thigh. Lateral post and foot positioner  applied to the table, the lower extremity was then prepped and draped  in usual sterile fashion from the  ankle to the tourniquet. Time-out procedure was performed. The limb was wrapped with an Esmarch bandage and the tourniquet inflated to 365 mmHg. We began the operation by making the anterior midline incision starting at handbreadth above the patella going over the patella 1 cm medial to and  4 cm distal to the tibial tubercle. Small bleeders in the skin and the  subcutaneous tissue identified and cauterized. Transverse retinaculum was incised and reflected medially and a medial parapatellar arthrotomy was accomplished. the patella was everted and theprepatellar fat pad resected. The superficial medial collateral  ligament was then elevated from anterior to posterior along the proximal  flare of the tibia and anterior half of the menisci resected. The knee was hyperflexed exposing bone on bone arthritis. Peripheral and notch osteophytes as well as the cruciate ligaments were then resected. We continued to  work our way around posteriorly along the proximal tibia, and externally  rotated the tibia subluxing it out from underneath the femur. A McHale  retractor was placed through the notch and a lateral Hohmann retractor  placed, and we then drilled through the proximal tibia in line with the  axis of the tibia followed by an intramedullary guide rod and 2-degree  posterior slope cutting guide. The tibial cutting guide was pinned into place  allowing resection of 4 mm of bone medially and about 6 mm of bone  laterally because of her varus deformity. Satisfied with the tibial resection, we then  entered the distal femur 2 mm anterior to the PCL origin with the  intramedullary guide rod and applied the distal femoral cutting guide  set at 72mm, with 5 degrees of valgus. This was pinned along the  epicondylar axis. At this point, the distal femoral cut was accomplished without difficulty. We then sized for a #4 narrow femoral component and pinned the guide in 3 degrees of external rotation.The chamfer  cutting guide was pinned into place. The anterior, posterior, and chamfer cuts were accomplished without difficulty followed by  the Sigma RP box cutting guide and the box cut. We also removed posterior osteophytes from the posterior femoral condyles. At this  time, the knee was brought into full extension. We checked our  extension and flexion gaps and found them symmetric at 46mm.  The patella thickness measured at 25 mm. We set the cutting guide at 15 and removed the posterior 9.5-10 mm  of the patella sized for 32 button and drilled the lollipop. The knee  was then once again hyperflexed exposing the proximal tibia. We sized for a #4 tibial base plate, applied the smokestack and the conical reamer followed by the the Delta fin keel punch. We then hammered into place the Sigma RP trial femoral component, inserted a 10-mm trial bearing, trial patellar button, and took the knee through range of motion from 0-130 degrees. No thumb pressure was required for patellar  tracking. At this point, all trial components were removed, a double batch of DePuy HV cement  was mixed and applied to all bony metallic mating surfaces except for the posterior condyles of the femur itself. In order, we  hammered into place the tibial tray and removed excess cement, the femoral component and removed excess cement, a 10-mm Sigma RP bearing  was inserted, and the knee brought to full extension with compression.  The patellar button was clamped into place, and excess cement  removed. While the cement cured the wound was irrigated out with normal saline solution pulse lavage, and medium Hemovac drains were placed.. Ligament stability and patellar tracking were checked and found to be excellent. The tourniquet was then released and hemostasis was obtained with cautery. The parapatellar arthrotomy was closed with  #1 ethibond suture. The subcutaneous tissue with 0 and 2-0 undyed  Vicryl suture, and 4-0 Monocryl.. A dressing of  Xeroform,  4 x 4, dressing sponges, Webril, and Ace wrap applied. Needle and sponge count were correct times 2.The patient awakened, extubated, and taken to recovery room without difficulty. Vascular status was normal, pulses 2+ and symmetric.   [place] A 03/25/2015, 12:24 PM

## 2015-03-25 NOTE — Anesthesia Preprocedure Evaluation (Addendum)
Anesthesia Evaluation  Patient identified by MRN, date of birth, ID band Patient awake    Reviewed: Allergy & Precautions, NPO status , Patient's Chart, lab work & pertinent test results  Airway Mallampati: II   Neck ROM: full    Dental   Pulmonary shortness of breath, sleep apnea , former smoker,  breath sounds clear to auscultation        Cardiovascular hypertension, Rhythm:regular Rate:Normal     Neuro/Psych Depression    GI/Hepatic   Endo/Other  Morbid obesity  Renal/GU      Musculoskeletal  (+) Arthritis -,   Abdominal   Peds  Hematology   Anesthesia Other Findings   Reproductive/Obstetrics                            Anesthesia Physical Anesthesia Plan  ASA: III  Anesthesia Plan: General and Regional   Post-op Pain Management: MAC Combined w/ Regional for Post-op pain   Induction: Intravenous  Airway Management Planned: Oral ETT  Additional Equipment:   Intra-op Plan:   Post-operative Plan: Extubation in OR  Informed Consent: I have reviewed the patients History and Physical, chart, labs and discussed the procedure including the risks, benefits and alternatives for the proposed anesthesia with the patient or authorized representative who has indicated his/her understanding and acceptance.     Plan Discussed with: CRNA, Anesthesiologist and Surgeon  Anesthesia Plan Comments:         Anesthesia Quick Evaluation

## 2015-03-25 NOTE — Evaluation (Signed)
Physical Therapy Evaluation Patient Details Name: Joe [name] MRN: [id number] DOB: 1941/10/03 Today's Date: 03/25/2015   History of Present Illness  s/p L TKA 03/25/15 with hx of OA  Past Medical History  Diagnosis Date  . Hypertension   . Syncope 12/11/2012  . Depression   . Hyperlipidemia   . Sleep apnea     CPAP  . Primary localized osteoarthritis of right knee   . Shortness of breath dyspnea   . Primary localized osteoarthritis of left knee    Past Surgical History  Procedure Laterality Date  . Eye surgery Left prosthesis  . Knee surgery Bilateral   . Rotator cuff repair    . 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 Total knee arthroplasty Right 09/17/2014    Procedure: RIGHT TOTAL KNEE ARTHROPLASTY Steroid injection LEFT KNEE;  Surgeon: [name], MD;  Location: [facility];  Service: Orthopedics;  Laterality: Right;     Clinical Impression  Pt is s/p TKA resulting in the deficits listed below (see PT Problem List).  Pt will benefit from skilled PT to increase their independence and safety with mobility to allow discharge to the venue listed below.   Ambulated on Room Air and O2 sats stayed in the low 90s%; reapplied supplemental O2 after walk.     Follow Up Recommendations Home health PT;Supervision/Assistance - 24 hour    Equipment Recommendations  None recommended by PT (Quite well equipped)    Recommendations for Other Services OT consult     Precautions / Restrictions Precautions Precautions: Knee Precaution Booklet Issued: No Precaution Comments: Pt educated to not allow any pillow or bolster under knee for healing with optimal range of motion.  Required Braces or Orthoses: Knee Immobilizer - Left Knee Immobilizer - Left: On when out of bed or walking;Discontinue once straight leg raise with < 10 degree lag Restrictions Weight Bearing Restrictions: Yes LLE Weight Bearing: Weight bearing as tolerated      Mobility  Bed Mobility Overal bed  mobility: Modified Independent                Transfers Overall transfer level: Needs assistance Equipment used: Rolling walker (2 wheeled) Transfers: Sit to/from Stand Sit to Stand: Min guard         General transfer comment: Cues for safety and ahnd placement  Ambulation/Gait Ambulation/Gait assistance: Min guard Ambulation Distance (Feet): 150 Feet Assistive device: Rolling walker (2 wheeled) Gait Pattern/deviations: Step-to pattern (emerging step through)   Gait velocity interpretation: Below normal speed for age/gender General Gait Details: Cues to self-monitor for activity tolerance; no gross buckling noted L knee (KI was used for safety on POD 1)  Stairs            Wheelchair Mobility    Modified Rankin (Stroke Patients Only)       Balance Overall balance assessment: Needs assistance         Standing balance support: Bilateral upper extremity supported Standing balance-[name]: Poor (approaching Fair)                               Pertinent Vitals/Pain Pain Assessment: 0-10 Pain Score: 3  Pain Location: L knee, initially a 3, finished at a 5 Pain Descriptors / Indicators: Aching Pain Intervention(s): Monitored during session;Premedicated before session;Limited activity within patient's tolerance    Home Living Family/patient expects to be discharged to:: Private residence Living Arrangements:  Spouse/significant other Available Help at Discharge: Family;Available 24 hours/day Type of Home: House Home Access: Level entry     Home Layout: One level Home Equipment: Walker - 2 wheels;Cane - single point;Bedside commode;Adaptive equipment;Shower seat      Prior Function Level of Independence: Independent with assistive device(s)         Comments: Used rolling walker     Hand Dominance        Extremity/Trunk Assessment   Upper Extremity Assessment: Defer to OT evaluation;Overall [facility] for tasks assessed            Lower Extremity Assessment: LLE deficits/detail   LLE Deficits / Details: Decreased AROM and strength limited by femoral nerve block.     Communication   Communication: No difficulties  Cognition Arousal/Alertness: Awake/alert Behavior During Therapy: WFL for tasks assessed/performed Overall Cognitive Status: Within Functional Limits for tasks assessed                      General Comments      Exercises        Assessment/Plan    PT Assessment Patient needs continued PT services  PT Diagnosis Difficulty walking;Acute pain   PT Problem List Decreased strength;Decreased range of motion;Decreased activity tolerance;Decreased balance;Decreased mobility;Decreased knowledge of use of DME;Decreased knowledge of precautions;Pain  PT Treatment Interventions DME instruction;Gait training;Stair training;Functional mobility training;Therapeutic activities;Therapeutic exercise;Balance training;Patient/family education   PT Goals (Current goals can be found in the Care Plan section) Acute Rehab PT Goals Patient Stated Goal: wants to be able to walk his dog Sophie PT Goal Formulation: With patient Time For Goal Achievement: 04/01/15 Potential to Achieve Goals: Good    Frequency 7X/week   Barriers to discharge        Co-evaluation               End of Session Equipment Utilized During Treatment: Gait belt;Left knee immobilizer Activity Tolerance: Patient tolerated treatment well Patient left: in chair;with call bell/phone within reach;with family/visitor present Nurse Communication: Mobility status         Time: 3361-2244 PT Time Calculation (min) (ACUTE ONLY): 34 min   Charges:   PT Evaluation $Initial PT Evaluation Tier I: 1 Procedure PT Treatments $Gait Training: 8-22 mins   PT G CodesQuin Hoop 03/25/2015, 5:22 PM  Roney Marion, [place] Pager [phone] Office [phone]

## 2015-03-25 NOTE — Anesthesia Procedure Notes (Addendum)
Anesthesia Regional Block:  Adductor canal block  Pre-Anesthetic Checklist: ,, timeout performed, Correct Patient, Correct Site, Correct Laterality, Correct Procedure, Correct Position, site marked, Risks and benefits discussed,  Surgical consent,  Pre-op evaluation,  At surgeon's request and post-op pain management  Laterality: Left  Prep: chloraprep       Needles:  Injection technique: Single-shot  Needle Type: Echogenic Needle     Needle Length: 9cm 9 cm Needle Gauge: 21 and 21 G    Additional Needles:  Procedures: ultrasound guided (picture in chart) Adductor canal block Narrative:  Start time: 03/25/2015 10:10 AM End time: 03/25/2015 10:20 AM Injection made incrementally with aspirations every 5 mL.  Performed by: Personally  Anesthesiologist: HODIERNE, ADAM  Additional Notes: Pt tolerated the procedure well.   Procedure Name: Intubation Date/Time: 03/25/2015 11:01 AM Performed by: Izora Gala Pre-anesthesia Checklist: Patient identified, Emergency Drugs available, Suction available and Patient being monitored Patient Re-evaluated:Patient Re-evaluated prior to inductionOxygen Delivery Method: Circle system utilized Preoxygenation: Pre-oxygenation with 100% oxygen Intubation Type: IV induction Ventilation: Mask ventilation without difficulty and Oral airway inserted - appropriate to patient size Laryngoscope Size: Miller and 3 Grade View: Grade III Tube type: Oral Tube size: 7.5 mm Number of attempts: 1 Airway Equipment and Method: Stylet Placement Confirmation: ETT inserted through vocal cords under direct vision,  positive ETCO2 and breath sounds checked- equal and bilateral Secured at: 23 cm Tube secured with: Tape Dental Injury: Teeth and Oropharynx as per pre-operative assessment

## 2015-03-25 NOTE — Interval H&P Note (Signed)
History and Physical Interval Note:  03/25/2015 10:44 AM  Joe [name]  has presented today for surgery, with the diagnosis of PRIMARY LOCALIZED OA  LEFT KNEE  The various methods of treatment have been discussed with the patient and family. After consideration of risks, benefits and other options for treatment, the patient has consented to  Procedure(s): TOTAL KNEE ARTHROPLASTY (Left) as a surgical intervention .  The patient's history has been reviewed, patient examined, no change in status, stable for surgery.  I have reviewed the patient's chart and labs.  Questions were answered to the patient's satisfaction.     [name] A

## 2015-03-25 NOTE — Transfer of Care (Signed)
Immediate Anesthesia Transfer of Care Note  Patient: Joe [name]  Procedure(s) Performed: Procedure(s): TOTAL KNEE ARTHROPLASTY (Left)  Patient Location: PACU  Anesthesia Type:General and Regional  Level of Consciousness: awake, alert , oriented and patient cooperative  Airway & Oxygen Therapy: Patient Spontanous Breathing and Patient connected to nasal cannula oxygen  Post-op Assessment: Report given to RN, Post -op Vital signs reviewed and stable, Patient moving all extremities and Patient moving all extremities X 4  Post vital signs: Reviewed and stable  Last Vitals:  Filed Vitals:   03/25/15 1300  BP: 134/63  Pulse: 99  Temp:   Resp: 16    Complications: No apparent anesthesia complications

## 2015-03-25 NOTE — Anesthesia Postprocedure Evaluation (Signed)
  Anesthesia Post-op Note  Patient: Joe [name]  Procedure(s) Performed: Procedure(s): TOTAL KNEE ARTHROPLASTY (Left)  Patient Location: PACU  Anesthesia Type: General, Regional   Level of Consciousness: awake, alert  and oriented  Airway and Oxygen Therapy: Patient Spontanous Breathing  Post-op Pain: moderate  Post-op Assessment: Post-op Vital signs reviewed  Post-op Vital Signs: Reviewed  Last Vitals:  Filed Vitals:   03/25/15 1436  BP: 126/60  Pulse: 79  Temp: 36.5 C  Resp: 14    Complications: No apparent anesthesia complications

## 2015-03-26 ENCOUNTER — Encounter (HOSPITAL_COMMUNITY): Payer: Self-pay | Admitting: Orthopedic Surgery

## 2015-03-26 LAB — CBC
HCT: 33.8 % — ABNORMAL LOW (ref 39.0–52.0)
HEMOGLOBIN: 10.8 g/dL — AB (ref 13.0–17.0)
MCH: 25.8 pg — ABNORMAL LOW (ref 26.0–34.0)
MCHC: 32 g/dL (ref 30.0–36.0)
MCV: 80.7 fL (ref 78.0–100.0)
PLATELETS: 187 10*3/uL (ref 150–400)
RBC: 4.19 MIL/uL — AB (ref 4.22–5.81)
RDW: 15 % (ref 11.5–15.5)
WBC: 16.2 10*3/uL — ABNORMAL HIGH (ref 4.0–10.5)

## 2015-03-26 LAB — BASIC METABOLIC PANEL
Anion gap: 9 (ref 5–15)
BUN: 19 mg/dL (ref 6–20)
CO2: 27 mmol/L (ref 22–32)
CREATININE: 0.86 mg/dL (ref 0.61–1.24)
Calcium: 8.2 mg/dL — ABNORMAL LOW (ref 8.9–10.3)
Chloride: 101 mmol/L (ref 101–111)
GFR calc Af Amer: >60 mL/min (ref >60)
GLUCOSE: 171 mg/dL — AB (ref 65–99)
Potassium: 4.1 mmol/L (ref 3.5–5.1)
SODIUM: 137 mmol/L (ref 135–145)

## 2015-03-26 NOTE — Progress Notes (Signed)
Physical Therapy Treatment Patient Details Name: Joe [name] MRN: [id number] DOB: 05/22/1941 Today's Date: 03/26/2015    History of Present Illness S/p L TKA on 03/25/15 with hx of:    Past Medical History  Diagnosis Date  . Hypertension   . Syncope 12/11/2012  . Depression   . Hyperlipidemia   . Sleep apnea     CPAP  . Primary localized osteoarthritis of right knee   . Shortness of breath dyspnea   . Primary localized osteoarthritis of left knee       PT Comments    Session focused on providing energy conservation techniques for bed mobility, increasing ambulation distance without use of knee immobilizer, and education on Total Knee Exercises in order to safely discharge home tomorrow. Joe [name] continues to progress well towards goals. Continue to recommend discharge home with home health PT.      Follow Up Recommendations  Home health PT;Supervision/Assistance - 24 hour     Equipment Recommendations  None recommended by PT    Recommendations for Other Services       Precautions / Restrictions Precautions Precautions: Knee Precaution Booklet Issued: No Restrictions Weight Bearing Restrictions: Yes LLE Weight Bearing: Weight bearing as tolerated    Mobility  Bed Mobility Overal bed mobility: Modified Independent                Transfers Overall transfer level: Needs assistance Equipment used: Rolling walker (2 wheeled) Transfers: Sit to/from Stand Sit to Stand: Min guard         General transfer comment: Cues for safety and hand placement  Ambulation/Gait Ambulation/Gait assistance: Min guard Ambulation Distance (Feet): 200 Feet (Greater than) Assistive device: Rolling walker (2 wheeled) Gait Pattern/deviations: Step-to pattern   Gait velocity interpretation: Below normal speed for age/gender General Gait Details: Cues to self-monitor for activity tolerance and knee control; no gross buckling noted in L knee (KI was not used)   Financial trader Rankin (Stroke Patients Only)       Balance Overall balance assessment: Needs assistance         Standing balance support: Bilateral upper extremity supported Standing balance-[name]: Fair                      Cognition Arousal/Alertness: Awake/alert Behavior During Therapy: WFL for tasks assessed/performed Overall Cognitive Status: Within Functional Limits for tasks assessed                      Exercises Total Joint Exercises Ankle Circles/Pumps: AROM;Both;10 reps;Supine Quad Sets: AROM;Left;20 reps;Supine Short Arc Quad: AROM;Left;10 reps;Supine Heel Slides: AROM;Left;10 reps;Supine Hip ABduction/ADduction: AROM;Left;10 reps;Supine Straight Leg Raises: AROM;Left;10 reps;Supine Goniometric ROM: Knee flexion 70 degrees, Knee extension approx. lacking 10 degrees     General Comments General comments (skin integrity, edema, etc.): Conducted session on room air, no DOE.      Pertinent Vitals/Pain Pain Assessment: 0-10 Pain Score: 3  Pain Location: L knee; 3 at rest, 7 during active knee flexion, 6 when ambulating Pain Descriptors / Indicators: Aching Pain Intervention(s): Monitored during session;Premedicated before session;Limited activity within patient's tolerance    Home Living                      Prior Function            PT Goals (current goals can now be found in  the care plan section) Acute Rehab PT Goals Patient Stated Goal: wants to be able to walk his dog Sophie PT Goal Formulation: With patient Time For Goal Achievement: 04/01/15 Potential to Achieve Goals: Good Progress towards PT goals: Progressing toward goals    Frequency  7X/week    PT Plan Current plan remains appropriate    Co-evaluation             End of Session Equipment Utilized During Treatment: Gait belt Activity Tolerance: Patient tolerated treatment well Patient left: in chair;with call bell/phone  within reach;with family/visitor present     Time: 3953-2023 PT Time Calculation (min) (ACUTE ONLY): 40 min  Charges:                       G Codes:      [name], SPT Acute Rehab Services Office: [phone]  [name] 03/26/2015, 11:24 AM

## 2015-03-26 NOTE — Progress Notes (Signed)
Subjective: 1 Day Post-Op Procedure(s) (LRB): TOTAL KNEE ARTHROPLASTY (Left) Patient reports pain as 3 on 0-10 scale.    Objective: Vital signs in last 24 hours: Temp:  [97.7 F (36.5 C)-98.1 F (36.7 C)] 98 F (36.7 C) (06/07 0537) Pulse Rate:  [70-99] 80 (06/07 0537) Resp:  [11-21] 16 (06/07 0537) BP: (109-188)/(58-85) 149/72 mmHg (06/07 0537) SpO2:  [94 %-99 %] 96 % (06/07 0537) FiO2 (%):  [32 %] 32 % (06/06 1501) Weight:  [121.11 kg (267 lb)] 121.11 kg (267 lb) (06/06 0919)  Intake/Output from previous day: 06/06 0701 - 06/07 0700 In: 1220 [P.O.:120; I.V.:1100] Out: 1050 [Urine:1000; Drains:50] Intake/Output this shift: Total I/O In: 120 [P.O.:120] Out: 650 [Urine:600; Drains:50]  No results for input(s): HGB in the last 72 hours. No results for input(s): WBC, RBC, HCT, PLT in the last 72 hours. No results for input(s): NA, K, CL, CO2, BUN, CREATININE, GLUCOSE, CALCIUM in the last 72 hours. No results for input(s): LABPT, INR in the last 72 hours.  ABD soft Neurovascular intact Sensation intact distally Intact pulses distally Dorsiflexion/Plantar flexion intact Incision: dressing C/D/I  Assessment/Plan: 1 Day Post-Op Procedure(s) (LRB): TOTAL KNEE ARTHROPLASTY (Left)  Principal Problem:   Primary localized osteoarthritis of left knee Active Problems:   HTN (hypertension)   HLD (hyperlipidemia)   Depression   Benign paroxysmal positional vertigo   RBBB   Sleep apnea   DJD (degenerative joint disease) of knee  Advance diet Up with therapy Plan for discharge tomorrow Discharge home with home health  Linda Hedges 03/26/2015, 6:58 AM

## 2015-03-26 NOTE — Care Management Note (Signed)
Case Management Note  Patient Details  Name: Joe [name] MRN: [id number] Date of Birth: [birth year]  Subjective/Objective:                 S/p left total knee arthroplasty   Action/Plan:     Set up with [facility] for HHPT by MD office. Spoke with patient, no change in discharge plan. [employer] providing CPM, patient already has rolling walker and 3N1. Patient stated that his wife will be home to assist but she has new diagnosis of myasthenia gravis and he asked if insurance would cover for someone to assist with household chores. Explained those services are not covered but gave patient list of private duty agencies in case they wish to hire someone to assist. Will continue to follow until discharge.  Expected Discharge Date:                  Expected Discharge Plan:  [place]  In-House Referral:  NA  Discharge planning Services  CM Consult  Post Acute Care Choice:  Durable Medical Equipment, Home Health Choice offered to:  Patient  DME Arranged:  CPM DME Agency:  [place]  HH Arranged:  PT [facility] Agency:  [place]  Status of Service:  In process, will continue to follow  Medicare Important Message Given:    Date Medicare IM Given:    Medicare IM give by:    Date Additional Medicare IM Given:    Additional Medicare Important Message give by:     If discussed at [place] of Stay Meetings, dates discussed:    Additional Comments:  Nila Nephew, RN 03/26/2015, 2:36 PM

## 2015-03-26 NOTE — Progress Notes (Signed)
Occupational Therapy Evaluation Patient Details Name: Joe [name] MRN: [id number] DOB: 1941/09/24 Today's Date: 03/26/2015    History of Present Illness s/p L TKA 03/25/15 with hx of OA   Past Medical History  Diagnosis Date  . Hypertension   . Syncope 12/11/2012  . Depression   . Hyperlipidemia   . Sleep apnea     CPAP  . Primary localized osteoarthritis of right knee   . Shortness of breath dyspnea   . Primary localized osteoarthritis of left knee    Past Surgical History  Procedure Laterality Date  . Eye surgery Left prosthesis  . Knee surgery Bilateral   . Rotator cuff repair    . 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 Pt will benefit from continued acute OT to address the below listed deficits and maximize independence with BADLs prior to d/c to venue below. PTA pt was mod I with ADLs, using rw or cane as needed for back pain. Pt currently min guard with LB ADLs and transfers. ADLs completed and education provided as detailed below.      Follow Up Recommendations  Supervision/Assistance - 24 hour;No OT follow up    Equipment Recommendations  None recommended by OT    Recommendations for Other Services       Precautions / Restrictions Precautions Precautions: Knee Precaution Booklet Issued: No Precaution Comments: reviewed Restrictions Weight Bearing Restrictions: Yes LLE Weight Bearing: Weight bearing as tolerated      Mobility Bed Mobility Overal bed mobility: Modified Independent             General bed mobility comments: in recliner  Transfers Overall transfer  level: Needs assistance Equipment used: Rolling walker (2 wheeled) Transfers: Sit to/from Stand Sit to Stand: Min guard         General transfer comment: from recliner, cues for technique    Balance Overall balance assessment: Needs assistance Sitting-balance support: Feet supported Sitting balance-[name]: Good     Standing balance support: Bilateral upper extremity supported;During functional activity Standing balance-[name]: Fair Standing balance comment: stood to wash hands at sink with no LOB                            ADL Overall ADL's : Needs assistance/impaired Eating/Feeding: Set up;Sitting   Grooming: Set up;Sitting   Upper Body Bathing: Set up;Sitting   Lower Body Bathing: Min guard;Sit to/from stand   Upper Body Dressing : Set up;Sitting   Lower Body Dressing: Min guard;Sit to/from stand   Toilet Transfer: Min guard;Ambulation;RW (3n1 over toilet)   Toileting- Clothing Manipulation and Hygiene: Min guard;Sit to/from stand   Tub/ Shower Transfer: Min guard;Ambulation;3 in 1;Rolling walker   Functional mobility during ADLs: Min guard;Rolling walker General ADL Comments: Pt ambualted from recliner to bathroom and completed toilet transfer, all at min guard level. Pt then stood at sink to wash hands. ADL education provided with pt with good recall from previous knee surgery, Pt indicated he plans to inquire about HHA assistance at d/c.      Vision     Perception  Praxis      Pertinent Vitals/Pain Pain Assessment: 0-10 Pain Score: 4  Pain Location: Lt knee at start of session in zero knee, 3 at end of session Pain Descriptors / Indicators: Aching Pain Intervention(s): Limited activity within patient's tolerance;Monitored during session;Repositioned;Premedicated before session     Hand Dominance Right   Extremity/Trunk Assessment Upper Extremity Assessment Upper Extremity Assessment: Overall WFL for tasks assessed   Lower  Extremity Assessment Lower Extremity Assessment: Defer to PT evaluation       Communication Communication Communication: No difficulties   Cognition Arousal/Alertness: Awake/alert Behavior During Therapy: WFL for tasks assessed/performed Overall Cognitive Status: Within Functional Limits for tasks assessed                     General Comments          Shoulder Instructions      Home Living Family/patient expects to be discharged to:: Private residence Living Arrangements: Spouse/significant other Available Help at Discharge: Family;Available 24 hours/day Type of Home: House Home Access: Level entry     Home Layout: One level     Bathroom Shower/Tub: Teacher, early years/pre: Standard Bathroom Accessibility: Yes How Accessible: Accessible via walker Home Equipment: [place] - 2 wheels;Cane - single point;Bedside commode;Adaptive equipment;Shower Research scientist (medical) Comments: per pt report spouse has myasthenia gravis       Prior Functioning/Environment Level of Independence: Independent with assistive device(s)        Comments: used rw or cane as needed to alleviate back pain    OT Diagnosis: Acute pain   OT Problem List: Impaired balance (sitting and/or standing);Decreased knowledge of use of DME or AE;Decreased knowledge of precautions;Pain   OT Treatment/Interventions: DME and/or AE instruction;Self-care/ADL training;Therapeutic activities;Patient/family education;Balance training    OT Goals(Current goals can be found in the care plan section) Acute Rehab OT Goals Patient Stated Goal: wants to be able to walk his dog Joe [name] OT Goal Formulation: With patient/family Time For Goal Achievement: 04/02/15 Potential to Achieve Goals: Good ADL Goals Pt Will Perform Lower Body Dressing: with modified independence;with adaptive equipment;sit to/from stand Pt Will Perform Tub/Shower Transfer: Tub transfer;with modified  independence;shower seat;ambulating;rolling walker  OT Frequency: Min 2X/week   Barriers to D/C:            Co-evaluation              End of Session Equipment Utilized During Treatment: Gait belt;Rolling walker CPM Left Knee Additional Comments: zero knee left off for lunch per pt request. Advised pt to call for asssitance to don after lunch.  Activity Tolerance: Patient tolerated treatment well Patient left: in chair;with call bell/phone within reach;with family/visitor present   Time: 1204-1216 OT Time Calculation (min): 12 min Charges:  OT General Charges $OT Visit: 1 Procedure OT Evaluation $Initial OT Evaluation Tier I: 1 Procedure G-Codes:    Hortencia Pilar 31-Mar-2015, 12:46 PM

## 2015-03-26 NOTE — Discharge Instructions (Signed)
Information on my medicine - ELIQUIS (apixaban)  This medication education was reviewed with me or my healthcare representative as part of my discharge preparation.  The pharmacist that spoke with me during my hospital stay was:  Lavenia Atlas, [facility]  Why was Eliquis prescribed for you? Eliquis was prescribed for you to reduce the risk of blood clots forming after orthopedic surgery.    What do You need to know about Eliquis? Take your Eliquis TWICE DAILY - one tablet in the morning and one tablet in the evening with or without food.  It would be best to take the dose about the same time each day.  If you have difficulty swallowing the tablet whole please discuss with your pharmacist how to take the medication safely.  Take Eliquis exactly as prescribed by your doctor and DO NOT stop taking Eliquis without talking to the doctor who prescribed the medication.  Stopping without other medication to take the place of Eliquis may increase your risk of developing a clot.  After discharge, you should have regular check-up appointments with your healthcare provider that is prescribing your Eliquis.  What do you do if you miss a dose? If a dose of ELIQUIS is not taken at the scheduled time, take it as soon as possible on the same day and twice-daily administration should be resumed.  The dose should not be doubled to make up for a missed dose.  Do not take more than one tablet of ELIQUIS at the same time.  Important Safety Information A possible side effect of Eliquis is bleeding. You should call your healthcare provider right away if you experience any of the following: ? Bleeding from an injury or your nose that does not stop. ? Unusual colored urine (red or dark brown) or unusual colored stools (red or black). ? Unusual bruising for unknown reasons. ? A serious fall or if you hit your head (even if there is no bleeding).  Some medicines may interact with Eliquis and might  increase your risk of bleeding or clotting while on Eliquis. To help avoid this, consult your healthcare provider or pharmacist prior to using any new prescription or non-prescription medications, including herbals, vitamins, non-steroidal anti-inflammatory drugs (NSAIDs) and supplements.  This website has more information on Eliquis (apixaban): http://www.eliquis.com/eliquis/home

## 2015-03-27 ENCOUNTER — Inpatient Hospital Stay (HOSPITAL_COMMUNITY): Payer: Medicare Other

## 2015-03-27 LAB — CBC
HCT: 30.9 % — ABNORMAL LOW (ref 39.0–52.0)
HEMOGLOBIN: 9.8 g/dL — AB (ref 13.0–17.0)
MCH: 25.9 pg — ABNORMAL LOW (ref 26.0–34.0)
MCHC: 31.7 g/dL (ref 30.0–36.0)
MCV: 81.5 fL (ref 78.0–100.0)
Platelets: 191 10*3/uL (ref 150–400)
RBC: 3.79 MIL/uL — ABNORMAL LOW (ref 4.22–5.81)
RDW: 15.3 % (ref 11.5–15.5)
WBC: 16.9 10*3/uL — AB (ref 4.0–10.5)

## 2015-03-27 LAB — BASIC METABOLIC PANEL
ANION GAP: 9 (ref 5–15)
BUN: 20 mg/dL (ref 6–20)
CALCIUM: 8.1 mg/dL — AB (ref 8.9–10.3)
CO2: 25 mmol/L (ref 22–32)
Chloride: 101 mmol/L (ref 101–111)
Creatinine, Ser: 0.84 mg/dL (ref 0.61–1.24)
GFR calc Af Amer: >60 mL/min (ref >60)
Glucose, Bld: 167 mg/dL — ABNORMAL HIGH (ref 65–99)
Potassium: 4 mmol/L (ref 3.5–5.1)
Sodium: 135 mmol/L (ref 135–145)

## 2015-03-27 MED ORDER — OXYCODONE HCL 5 MG PO TABS: ORAL_TABLET | ORAL | Status: DC

## 2015-03-27 MED ORDER — ASPIRIN EC 325 MG PO TBEC: DELAYED_RELEASE_TABLET | ORAL | Status: DC

## 2015-03-27 MED ORDER — BISACODYL 10 MG RE SUPP
10.0000 mg | Freq: Once | RECTAL | Status: AC
  Administered 2015-03-27: 10 mg via RECTAL
  Filled 2015-03-27: qty 1

## 2015-03-27 MED ORDER — POLYETHYLENE GLYCOL 3350 17 G PO PACK: 17.0000 g | PACK | Freq: Two times a day (BID) | ORAL | Status: DC

## 2015-03-27 NOTE — Discharge Summary (Signed)
Patient ID: Joe [name] MRN: [id number] DOB/AGE: 22-Sep-1941 74 y.o.  Admit date: 03/25/2015 Discharge date: 03/27/2015  Admission Diagnoses:  Principal Problem:   Primary localized osteoarthritis of left knee Active Problems:   HTN (hypertension)   HLD (hyperlipidemia)   Depression   Benign paroxysmal positional vertigo   RBBB   Sleep apnea   DJD (degenerative joint disease) of knee   Discharge Diagnoses:  Same  Past Medical History  Diagnosis Date  . Hypertension   . Syncope 12/11/2012  . Depression   . Hyperlipidemia   . Sleep apnea     CPAP  . Primary localized osteoarthritis of right knee   . Shortness of breath dyspnea   . Primary localized osteoarthritis of left knee     Surgeries: Procedure(s): TOTAL KNEE ARTHROPLASTY on 03/25/2015   Consultants:    Discharged Condition: Improved  Hospital Course: Joe [name] is an 74 y.o. male who was admitted 03/25/2015 for operative treatment ofPrimary localized osteoarthritis of left knee. Patient has severe unremitting pain that affects sleep, daily activities, and work/hobbies. After pre-op clearance the patient was taken to the operating room on 03/25/2015 and underwent  Procedure(s): TOTAL KNEE ARTHROPLASTY.    Patient was given perioperative antibiotics: Anti-infectives    Start     Dose/Rate Route Frequency Ordered Stop   03/25/15 1045  vancomycin (VANCOCIN) 1,500 mg in sodium chloride 0.9 % 500 mL IVPB     1,500 mg 250 mL/hr over 120 Minutes Intravenous On call to O.R. 03/24/15 1431 03/25/15 1115       Patient was given sequential compression devices, early ambulation, and chemoprophylaxis to prevent DVT.  Patient benefited maximally from hospital stay and there were no complications.    Recent vital signs: Patient Vitals for the past 24 hrs:  BP Temp Temp src Pulse Resp SpO2  03/27/15 1100 - - - (!) 124 - 97 %  03/27/15 1052 (!) 163/71 mmHg - - - - -  03/27/15 0438 (!) 163/71 mmHg 98.4 F (36.9 C) Oral 73 17 97  %  03/26/15 2100 123/60 mmHg 98.3 F (36.8 C) Oral 92 17 95 %  03/26/15 1300 (!) 143/68 mmHg 98.6 F (37 C) - (!) 101 18 94 %     Recent laboratory studies:  Recent Labs  03/26/15 0614 03/27/15 0538  WBC 16.2* 16.9*  HGB 10.8* 9.8*  HCT 33.8* 30.9*  PLT 187 191  NA 137 135  K 4.1 4.0  CL 101 101  CO2 27 25  BUN 19 20  CREATININE 0.86 0.84  GLUCOSE 171* 167*  CALCIUM 8.2* 8.1*     Discharge Medications:     Medication List    STOP taking these medications        apixaban 2.5 MG Tabs tablet  Commonly known as:  ELIQUIS     celecoxib 200 MG capsule  Commonly known as:  CELEBREX     simvastatin 20 MG tablet  Commonly known as:  ZOCOR      TAKE these medications        aspirin EC 325 MG tablet  1 tab a day for the next 30 days to prevent blood clots     citalopram 20 MG tablet  Commonly known as:  CELEXA  Take 20 mg by mouth daily.     DSS 100 MG Caps  Take 100 mg by mouth 2 (two) times daily.     furosemide 40 MG tablet  Commonly known as:  LASIX  Take  40 mg by mouth daily.     lisinopril-hydrochlorothiazide 20-12.5 MG per tablet  Commonly known as:  PRINZIDE,ZESTORETIC  Take 1 tablet by mouth 2 (two) times daily.     loratadine 10 MG tablet  Commonly known as:  CLARITIN  Take 1 tablet (10 mg total) by mouth daily.     nabumetone 500 MG tablet  Commonly known as:  RELAFEN  Take 500 mg by mouth 2 (two) times daily. Will stop 1 week prior to surgery     oxyCODONE 5 MG immediate release tablet  Commonly known as:  Oxy IR/ROXICODONE  1-2 tablets every 4-6 hrs as needed for pain     polyethylene glycol packet  Commonly known as:  MIRALAX / GLYCOLAX  Take 17 g by mouth 2 (two) times daily.     tamsulosin 0.4 MG Caps capsule  Commonly known as:  FLOMAX  Take 0.4 mg by mouth daily.     traZODone 50 MG tablet  Commonly known as:  DESYREL  Take 50 mg by mouth at bedtime as needed. sleep     verapamil 120 MG CR tablet  Commonly known as:   CALAN-SR  Take 120 mg by mouth daily.        Diagnostic Studies: Dg Chest 2 View  03/27/2015   CLINICAL DATA:  74 year old male with wheezing and productive cough. Leukocytosis. Initial encounter.  EXAM: CHEST  2 VIEW  COMPARISON:  09/10/2014.  FINDINGS: Chronic large lung volumes. Stable mild cardiomegaly. Other mediastinal contours are within normal limits. The tracheal air column appears stable. No pneumothorax or pulmonary edema. No pleural effusion or consolidation. Stable mildly increased interstitial and lower lobe bronchovascular markings. No acute pulmonary opacity. No acute osseous abnormality identified.  IMPRESSION: No acute cardiopulmonary abnormality.   Electronically Signed   By: Genevie Ann M.D.   On: 03/27/2015 09:48    Disposition: 01-Home or Self Care      Discharge Instructions    CPM    Complete by:  As directed   Continuous passive motion machine (CPM):      Use the CPM from 0 to 90 for 6 hours per day.       You may break it up into 2 or 3 sessions per day.      Use CPM for 2 weeks or until you are told to stop.     Call MD / Call 911    Complete by:  As directed   If you experience chest pain or shortness of breath, CALL 911 and be transported to the hospital emergency room.  If you develope a fever above 101 F, pus (white drainage) or increased drainage or redness at the wound, or calf pain, call your surgeon's office.     Change dressing    Complete by:  As directed   Change the gauze dressing daily with sterile 4 x 4 inch gauze and apply TED hose.  DO NOT REMOVE BANDAGE OVER SURGICAL INCISION.  [place] WHOLE LEG INCLUDING OVER THE WATERPROOF BANDAGE WITH SOAP AND WATER EVERY DAY.     Constipation Prevention    Complete by:  As directed   Drink plenty of fluids.  Prune juice may be helpful.  You may use a stool softener, such as Colace (over the counter) 100 mg twice a day.  Use MiraLax (over the counter) for constipation as needed.     Diet - low sodium heart healthy     Complete by:  As directed  Discharge instructions    Complete by:  As directed   INSTRUCTIONS AFTER JOINT REPLACEMENT   Remove items at home which could result in a fall. This includes throw rugs or furniture in walking pathways ICE to the affected joint every three hours while awake for 30 minutes at a time, for at least the first 3-5 days, and then as needed for pain and swelling.  Continue to use ice for pain and swelling. You may notice swelling that will progress down to the foot and ankle.  This is normal after surgery.  Elevate your leg when you are not up walking on it.   Continue to use the breathing machine you got in the hospital (incentive spirometer) which will help keep your temperature down.  It is common for your temperature to cycle up and down following surgery, especially at night when you are not up moving around and exerting yourself.  The breathing machine keeps your lungs expanded and your temperature down.   DIET:  As you were doing prior to hospitalization, we recommend a well-balanced diet.  DRESSING / WOUND CARE / SHOWERING  Keep the surgical dressing until follow up.  The dressing is water proof, so you can shower without any extra covering.  IF THE DRESSING FALLS OFF or the wound gets wet inside, change the dressing with sterile gauze.  Please use good hand washing techniques before changing the dressing.  Do not use any lotions or creams on the incision until instructed by your surgeon.    ACTIVITY  Increase activity slowly as tolerated, but follow the weight bearing instructions below.   No driving for 6 weeks or until further direction given by your physician.  You cannot drive while taking narcotics.  No lifting or carrying greater than 10 lbs. until further directed by your surgeon. Avoid periods of inactivity such as sitting longer than an hour when not asleep. This helps prevent blood clots.  You may return to work once you are authorized by your  doctor.     WEIGHT BEARING   Weight bearing as tolerated with assist device (walker, cane, etc) as directed, use it as long as suggested by your surgeon or therapist, typically at least 2 weeks.   EXERCISES  Results after joint replacement surgery are often greatly improved when you follow the exercise, range of motion and muscle strengthening exercises prescribed by your doctor. Safety measures are also important to protect the joint from further injury. Any time any of these exercises cause you to have increased pain or swelling, decrease what you are doing until you are comfortable again and then slowly increase them. If you have problems or questions, call your caregiver or physical therapist for advice.   Rehabilitation is important following a joint replacement. After just a few days of immobilization, the muscles of the leg can become weakened and shrink (atrophy).  These exercises are designed to build up the tone and strength of the thigh and leg muscles and to improve motion. Often times heat used for twenty to thirty minutes before working out will loosen up your tissues and help with improving the range of motion but do not use heat for the first two weeks following surgery (sometimes heat can increase post-operative swelling).   These exercises can be done on a training (exercise) mat, on the floor, on a table or on a bed. Use whatever works the best and is most comfortable for you.    Use music or television while you  are exercising so that the exercises are a pleasant break in your day. This will make your life better with the exercises acting as a break in your routine that you can look forward to.   Perform all exercises about fifteen times, three times per day or as directed.  You should exercise both the operative leg and the other leg as well.   Exercises include:   Quad Sets - Tighten up the muscle on the front of the thigh (Quad) and hold for 5-10 seconds.   Straight Leg  Raises - With your knee straight (if you were given a brace, keep it on), lift the leg to 60 degrees, hold for 3 seconds, and slowly lower the leg.  Perform this exercise against resistance later as your leg gets stronger.  Leg Slides: Lying on your back, slowly slide your foot toward your buttocks, bending your knee up off the floor (only go as far as is comfortable). Then slowly slide your foot back down until your leg is flat on the floor again.  Angel Wings: Lying on your back spread your legs to the side as far apart as you can without causing discomfort.  Hamstring Strength:  Lying on your back, push your heel against the floor with your leg straight by tightening up the muscles of your buttocks.  Repeat, but this time bend your knee to a comfortable angle, and push your heel against the floor.  You may put a pillow under the heel to make it more comfortable if necessary.   A rehabilitation program following joint replacement surgery can speed recovery and prevent re-injury in the future due to weakened muscles. Contact your doctor or a physical therapist for more information on knee rehabilitation.    CONSTIPATION  Constipation is defined medically as fewer than three stools per week and severe constipation as less than one stool per week.  Even if you have a regular bowel pattern at home, your normal regimen is likely to be disrupted due to multiple reasons following surgery.  Combination of anesthesia, postoperative narcotics, change in appetite and fluid intake all can affect your bowels.   YOU MUST use at least one of the following options; they are listed in order of increasing strength to get the job done.  They are all available over the counter, and you may need to use some, POSSIBLY even all of these options:    Drink plenty of fluids (prune juice may be helpful) and high fiber foods Colace 100 mg by mouth twice a day  Senokot for constipation as directed and as needed Dulcolax  (bisacodyl), take with full glass of water  Miralax (polyethylene glycol) once or twice a day as needed.  If you have tried all these things and are unable to have a bowel movement in the first 3-4 days after surgery call either your surgeon or your primary doctor.    If you experience loose stools or diarrhea, hold the medications until you stool forms back up.  If your symptoms do not get better within 1 week or if they get worse, check with your doctor.  If you experience "the worst abdominal pain ever" or develop nausea or vomiting, please contact the office immediately for further recommendations for treatment.   ITCHING:  If you experience itching with your medications, try taking only a single pain pill, or even half a pain pill at a time.  You can also use Benadryl over the counter for itching or also to  help with sleep.   TED HOSE STOCKINGS:  Use stockings on both legs until for at least 2 weeks or as directed by physician office. They may be removed at night for sleeping.  MEDICATIONS:  See your medication summary on the "After Visit Summary" that nursing will review with you.  You may have some home medications which will be placed on hold until you complete the course of blood thinner medication.  It is important for you to complete the blood thinner medication as prescribed.  PRECAUTIONS:  If you experience chest pain or shortness of breath - call 911 immediately for transfer to the hospital emergency department.   If you develop a fever greater that 101 F, purulent drainage from wound, increased redness or drainage from wound, foul odor from the wound/dressing, or calf pain - CONTACT YOUR SURGEON.                                                   FOLLOW-UP APPOINTMENTS:  If you do not already have a post-op appointment, please call the office for an appointment to be seen by your surgeon.  Guidelines for how soon to be seen are listed in your "After Visit Summary", but are typically  between 1-4 weeks after surgery.  OTHER INSTRUCTIONS:   Knee Replacement:  Do not place pillow under knee, focus on keeping the knee straight while resting. CPM instructions: 0-90 degrees, 2 hours in the morning, 2 hours in the afternoon, and 2 hours in the evening. Place foam block, curve side up under heel at all times except when in CPM or when walking.  DO NOT modify, tear, cut, or change the foam block in any way.  MAKE SURE YOU:  Understand these instructions.  Get help right away if you are not doing well or get worse.    Thank you for letting us be a part of your medical care team.  It is a privilege we respect greatly.  We hope these instructions will help you stay on track for a fast and full recovery!     Do not put a pillow under the knee. Place it under the heel.    Complete by:  As directed   Place gray foam block, curve side up under heel at all times except when in CPM or when walking.  DO NOT modify, tear, cut, or change in any way the gray foam block.     Increase activity slowly as tolerated    Complete by:  As directed      TED hose    Complete by:  As directed   Use stockings (TED hose) for 2 weeks on both leg(s).  You may remove them at night for sleeping.           Follow-up Information    Follow up with [facility].   Why:  They will contact you to schedule home therapy visits.   Contact information:   [address] [place] [address] [phone]       Follow up with [name], MD On 04/08/2015.   Specialty:  Orthopedic Surgery   Why:  appt time 3:30 pm   Contact information:   [address] [place] [place] [address] [phone]        Signed: Linda Hedges 03/27/2015, 12:05 PM

## 2015-03-27 NOTE — Progress Notes (Signed)
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    PT Comments    Joe [name] was on the commode upon entering the room to begin PT session. He stated that he was in increased pain today after walking a full lap on the floor last night, but he still agreed to ambulate a short distance during today's session. He required increased UE support during ambulation due to pain and presented with an antalgic gait with less weight acceptance on the LLE. The rest of the session focused on ensuring safe discharge home, and answering any lingering questions. Joe [name] is progressing well towards his goals, despite today's increased pain. Continue to recommend discharge home with HHPT.  During ambulation today, Joe [name] experienced increased dyspnea, so pulse rate and SpO2 were taken at the end of session. Vitals appeared normal and stable following exertion and he was not symptomatic.    03/27/15 1100  Vital Signs  Pulse Rate (!) 124 (After ambulating approximately 100 feet)  Pulse Rate Source Dinamap  Patient Position (if appropriate) Sitting  Oxygen Therapy  SpO2 97 % (After ambulating approx. 100 feet)  O2 Device Room Air     Follow Up Recommendations  Home health PT;Supervision/Assistance - 24 hour     Equipment Recommendations  None recommended by PT    Recommendations for Other Services       Precautions / Restrictions Precautions Precautions: Knee Precaution Booklet Issued: No Restrictions Weight Bearing Restrictions: Yes LLE Weight Bearing: Weight bearing as tolerated    Mobility  Bed Mobility                  Transfers Overall transfer level: Modified independent Equipment used: Rolling walker (2 wheeled) Transfers: Sit to/from Stand Sit to Stand:  Modified independent (Device/Increase time)            Ambulation/Gait Ambulation/Gait assistance: Min guard Ambulation Distance (Feet): 100 Feet Assistive device: Rolling walker (2 wheeled) Gait Pattern/deviations: Decreased stride length;Antalgic;Trunk flexed (Increased WB through bilateral UE due to increased pain)   Gait velocity interpretation: Below normal speed for age/gender General Gait Details: Cues to self-monitor for activity tolerance and knee control; no gross buckling noted in L knee (KI was not used)   Financial trader Rankin (Stroke Patients Only)       Balance Overall balance assessment: Needs assistance         Standing balance support: Bilateral upper extremity supported;During functional activity Standing balance-[name]: Fair Standing balance comment: Stood to wash hands at sink with no LOB                    Cognition Arousal/Alertness: Awake/alert Behavior During Therapy: WFL for tasks assessed/performed Overall Cognitive Status: Within Functional Limits for tasks assessed                      Exercises      General Comments General comments (skin integrity, edema, etc.): Conducted session on room air, increased dyspnea on exertion during ambulation due to increased pain levels. At end of session, O2 sats were 97 and rising in seated position and HR was 124 bpm.      Pertinent Vitals/Pain Pain  Assessment: 0-10 Pain Score: 7  Pain Location: L knee; 7 at beginning of session after using the commode Pain Descriptors / Indicators: Aching Pain Intervention(s): Limited activity within patient's tolerance;Monitored during session;Premedicated before session    Home Living                      Prior Function            PT Goals (current goals can now be found in the care plan section) Acute Rehab PT Goals Patient Stated Goal: wants to be able to walk his dog Sophie PT  Goal Formulation: With patient Time For Goal Achievement: 04/01/15 Potential to Achieve Goals: Good Progress towards PT goals: Progressing toward goals    Frequency  7X/week    PT Plan Current plan remains appropriate    Co-evaluation             End of Session Equipment Utilized During Treatment: Gait belt Activity Tolerance: Patient limited by pain Patient left: Other (comment);with call bell/phone within reach (on commode)     Time: 8676-7209 PT Time Calculation (min) (ACUTE ONLY): 14 min  Charges:                       G Codes:      [name], SPT [place] Office: [phone]   [name] 03/27/2015, 11:14 AM

## 2015-03-27 NOTE — Progress Notes (Signed)
Subjective: 2 Days Post-Op Procedure(s) (LRB): TOTAL KNEE ARTHROPLASTY (Left) Patient reports pain as 5 on 0-10 scale.    Objective: Vital signs in last 24 hours: Temp:  [98.3 F (36.8 C)-98.6 F (37 C)] 98.4 F (36.9 C) (06/08 0438) Pulse Rate:  [73-101] 73 (06/08 0438) Resp:  [17-18] 17 (06/08 0438) BP: (123-163)/(60-71) 163/71 mmHg (06/08 0438) SpO2:  [94 %-97 %] 97 % (06/08 0438)  Intake/Output from previous day: 06/07 0701 - 06/08 0700 In: 600 [P.O.:600] Out: 850 [Urine:850] Intake/Output this shift:     Recent Labs  03/26/15 0614 03/27/15 0538  HGB 10.8* 9.8*    Recent Labs  03/26/15 0614 03/27/15 0538  WBC 16.2* 16.9*  RBC 4.19* 3.79*  HCT 33.8* 30.9*  PLT 187 191    Recent Labs  03/26/15 0614 03/27/15 0538  NA 137 135  K 4.1 4.0  CL 101 101  CO2 27 25  BUN 19 20  CREATININE 0.86 0.84  GLUCOSE 171* 167*  CALCIUM 8.2* 8.1*   No results for input(s): LABPT, INR in the last 72 hours.  ABD soft Neurovascular intact Sensation intact distally Intact pulses distally Dorsiflexion/Plantar flexion intact Incision: scant drainage  Still has persistant productive cough with rising WBC   Will order CXR  Assessment/Plan: 2 Days Post-Op Procedure(s) (LRB): TOTAL KNEE ARTHROPLASTY (Left) Advance diet Up with therapy Discharge home with home health   Will hold discharge until after chest xray. Josselin Gaulin J 03/27/2015, 8:20 AM

## 2015-04-08 ENCOUNTER — Other Ambulatory Visit: Payer: Self-pay | Admitting: Orthopedic Surgery

## 2015-04-08 ENCOUNTER — Ambulatory Visit (HOSPITAL_COMMUNITY): Payer: Medicare Other | Attending: Cardiology

## 2015-04-08 DIAGNOSIS — M7989 Other specified soft tissue disorders: Secondary | ICD-10-CM

## 2015-07-01 ENCOUNTER — Encounter: Payer: Self-pay | Admitting: Cardiovascular Disease

## 2015-08-19 ENCOUNTER — Encounter: Payer: Self-pay | Admitting: Cardiovascular Disease

## 2015-08-19 NOTE — Progress Notes (Signed)
Patient ID: Joe [name], male   DOB: [birth year], 74 y.o.   MRN: [id number]  73 y.o.  referred by [facility] initially in 2015  for preop clearance orthopedic surgery Has had scope of both knees and right shoulder with general anesthesia with no issues.  No bleeding diathesis.  No history of chest pain or CAD.  2015  going to gym for light weights and cardio with no issues.  Post left [facility] June 2016 has season tickets to [place]  He is a retired Geologist, engineering  .  Denies sleep apnea despite body habitus  HTN on Rx No history of DVT but mild edema from obesity  No premature family history of sudden death or heart issues.  Quit smoking in 98   [facility]"  [address] at gym thought to be postural/vertigo with negative w/u r/o normal telemetry and baseline ECG with RBBB no bradycardia or tachycardia identified Study Conclusions  - Left ventricle: The cavity size was normal. There was mild concentric hypertrophy. Systolic function was normal. The estimated ejection fraction was in the range of 60% to 65%. Wall motion was normal; there were no regional wall motion abnormalities. - Left atrium: The atrium was mildly dilated. - Pulmonary arteries: Systolic pressure was mildly increased. PA peak pressure: 2mm Hg (S).  Having dyspnea with PT for knees  Dr Brigitte Pulse wanted heart checked out again  ROS: Denies fever, malais, weight loss, blurry vision, decreased visual acuity, cough, sputum, SOB, hemoptysis, pleuritic pain, palpitaitons, heartburn, abdominal pain, melena, lower extremity edema, claudication, or rash.  All other systems reviewed and negative   General: Affect appropriate Obese white male  HEENT: normal Neck supple with no adenopathy JVP normal no bruits no thyromegaly Lungs clear with no wheezing and good diaphragmatic motion Heart:  S1/S2 no murmur,rub, gallop or click PMI normal Abdomen: benighn, BS positve, no tenderness, no AAA no bruit.  No HSM or HJR Distal  pulses intact with no bruits Trace bilateral LE  edema Neuro non-focal Skin warm and dry No muscular weakness  Medications Current Outpatient Prescriptions  Medication Sig Dispense Refill  . aspirin EC 325 MG tablet 1 tab a day for the next 30 days to prevent blood clots 30 tablet 0  . citalopram (CELEXA) 20 MG tablet Take 20 mg by mouth daily.    Marland Kitchen docusate sodium 100 MG CAPS Take 100 mg by mouth 2 (two) times daily. 60 capsule 0  . lisinopril-hydrochlorothiazide (PRINZIDE,ZESTORETIC) 20-12.5 MG per tablet Take 1 tablet by mouth 2 (two) times daily.     Marland Kitchen loratadine (CLARITIN) 10 MG tablet Take 10 mg by mouth daily as needed for allergies.    . Tamsulosin HCl (FLOMAX) 0.4 MG CAPS Take 0.4 mg by mouth daily.    . traZODone (DESYREL) 50 MG tablet Take 50 mg by mouth at bedtime as needed. sleep  3  . verapamil (CALAN-SR) 120 MG CR tablet Take 120 mg by mouth daily.     No current facility-administered medications for this visit.    Allergies Demerol; Meloxicam; Keflex; and Penicillins  Family History: Family History  Problem Relation Age of Onset  . Diabetes Maternal Grandmother   . Heart attack Brother   . Heart attack Father   . Hypertension Father   . Heart disease Mother   . Hypertension Mother     Social History: Social History   Social History  . Marital Status: Married    Spouse Name: N/A  . Number  of Children: N/A  . Years of Education: N/A   Occupational History  . Not on file.   Social History Main Topics  . Smoking status: Former Smoker    Quit date: 10/19/1996  . Smokeless tobacco: Never Used  . Alcohol Use: Yes     Comment: occassional  . Drug Use: No  . Sexual Activity: Not on file   Other Topics Concern  . Not on file   Social History Narrative    Electrocardiogram: 4/14 SR RBBB rate 96 01/2013   06/2014  SR rate 67 LAD RBBB no sig change   08/21/15  SR rate 71  LVH  Assessment and Plan Syncope: resolved no recurrence HTN: Well controlled.   Continue current medications and low sodium Dash type diet.   RBBB: resolved ECG normal Dsypnea:  Likely from obesity.  Check BMET/BNP  ETT with sats and Echo  F/U with me in 6 months   Jenkins Rouge

## 2015-08-21 ENCOUNTER — Ambulatory Visit (INDEPENDENT_AMBULATORY_CARE_PROVIDER_SITE_OTHER): Payer: Medicare Other | Admitting: Cardiovascular Disease

## 2015-08-21 ENCOUNTER — Encounter: Payer: Self-pay | Admitting: Cardiovascular Disease

## 2015-08-21 VITALS — BP 150/70 | HR 71 | Ht 67.0 in | Wt 268.8 lb

## 2015-08-21 DIAGNOSIS — R0600 Dyspnea, unspecified: Secondary | ICD-10-CM | POA: Diagnosis not present

## 2015-08-21 DIAGNOSIS — I10 Essential (primary) hypertension: Secondary | ICD-10-CM | POA: Diagnosis not present

## 2015-08-21 DIAGNOSIS — R079 Chest pain, unspecified: Secondary | ICD-10-CM

## 2015-08-21 LAB — BASIC METABOLIC PANEL
BUN: 20 mg/dL (ref 7–25)
CALCIUM: 8.8 mg/dL (ref 8.6–10.3)
CHLORIDE: 102 mmol/L (ref 98–110)
CO2: 29 mmol/L (ref 20–31)
CREATININE: 0.93 mg/dL (ref 0.70–1.18)
Glucose, Bld: 99 mg/dL (ref 65–99)
Potassium: 3.7 mmol/L (ref 3.5–5.3)
Sodium: 138 mmol/L (ref 135–146)

## 2015-08-21 LAB — BRAIN NATRIURETIC PEPTIDE: BRAIN NATRIURETIC PEPTIDE: 67.8 pg/mL (ref 0.0–100.0)

## 2015-08-21 NOTE — Patient Instructions (Signed)
Medication Instructions:  Your physician recommends that you continue on your current medications as directed. Please refer to the Current Medication list given to you today.  Labwork: TODAY   BMET BNP   Testing/Procedures: Your physician has requested that you have an echocardiogram. Echocardiography is a painless test that uses sound waves to create images of your heart. It provides your doctor with information about the size and shape of your heart and how well your heart's chambers and valves are working. This procedure takes approximately one hour. There are no restrictions for this procedure.  Your physician has requested that you have an exercise tolerance test. For further information please visit [email or web address]. Please also follow instruction sheet, as given.    Follow-Up: Your physician wants you to follow-up in: [place] Cathren Laine will receive a reminder letter in the mail two months in advance. If you don't receive a letter, please call our office to schedule the follow-up appointment.  Any Other Special Instructions Will Be Listed Below (If Applicable).     If you need a refill on your cardiac medications before your next appointment, please call your pharmacy. \

## 2015-09-09 ENCOUNTER — Ambulatory Visit (INDEPENDENT_AMBULATORY_CARE_PROVIDER_SITE_OTHER): Payer: Medicare Other

## 2015-09-09 ENCOUNTER — Encounter: Payer: Medicare Other | Admitting: Physician Assistant

## 2015-09-09 ENCOUNTER — Other Ambulatory Visit: Payer: Self-pay

## 2015-09-09 ENCOUNTER — Ambulatory Visit (HOSPITAL_COMMUNITY): Payer: Medicare Other | Attending: Cardiology

## 2015-09-09 DIAGNOSIS — I358 Other nonrheumatic aortic valve disorders: Secondary | ICD-10-CM | POA: Insufficient documentation

## 2015-09-09 DIAGNOSIS — I10 Essential (primary) hypertension: Secondary | ICD-10-CM | POA: Insufficient documentation

## 2015-09-09 DIAGNOSIS — R0600 Dyspnea, unspecified: Secondary | ICD-10-CM

## 2015-09-09 DIAGNOSIS — I071 Rheumatic tricuspid insufficiency: Secondary | ICD-10-CM | POA: Insufficient documentation

## 2015-09-09 DIAGNOSIS — R079 Chest pain, unspecified: Secondary | ICD-10-CM

## 2015-09-09 LAB — EXERCISE TOLERANCE TEST
CHL CUP RESTING HR STRESS: 66 {beats}/min
CHL RATE OF PERCEIVED EXERTION: 15
CSEPHR: 110 %
CSEPPHR: 162 {beats}/min
Estimated workload: 6.4 METS
Exercise duration (min): 4 min
Exercise duration (sec): 31 s
MPHR: 147 {beats}/min

## 2015-09-17 ENCOUNTER — Encounter: Payer: Self-pay | Admitting: Podiatry

## 2015-09-17 ENCOUNTER — Ambulatory Visit (INDEPENDENT_AMBULATORY_CARE_PROVIDER_SITE_OTHER): Payer: Medicare Other | Admitting: Podiatry

## 2015-09-17 VITALS — BP 193/79 | HR 72 | Resp 16

## 2015-09-17 DIAGNOSIS — L600 Ingrowing nail: Secondary | ICD-10-CM | POA: Diagnosis not present

## 2015-09-17 DIAGNOSIS — B353 Tinea pedis: Secondary | ICD-10-CM | POA: Diagnosis not present

## 2015-09-17 MED ORDER — NEOMYCIN-POLYMYXIN-HC 1 % OT SOLN: OTIC | Status: DC

## 2015-09-17 MED ORDER — KETOCONAZOLE 2 % EX CREA: 1.0000 "application " | TOPICAL_CREAM | Freq: Every day | CUTANEOUS | Status: DC

## 2015-09-17 NOTE — Patient Instructions (Signed)

## 2015-09-17 NOTE — Progress Notes (Signed)
He presents today with a chief complaint of a recurring painful toenails second digit of the left foot. He states that he continues to grow thick and fall off on a repetitive basis. States that he does this every year. He states that over the past few months he has developed pain on the plantar aspect of the foot with a patchy area of dry skin which has now since began to crack. He denies any rash anywhere else on the body he denies a history of psoriasis. Denies history of eczema.  Objective: Vital signs are stable he is alert and oriented 3. Pulses are strongly palpable bilateral. Plantar aspect of the bilateral foot demonstrates an area of xerosis medial longitudinal arch and central transverse arch forefoot bilateral. He also has skin fissures in these areas which do not demonstrate any type of bacterial infection. Toenails second digit left does demonstrate thick dark discoloration which appears to be subungual hematoma. Currently there is no infection.  Assessment: Possible tinea pedis rule out eczema plantar aspect bilateral foot. Fissured skin plantar aspect bilateral foot. Ingrown toenails second digit left foot.  Plan: Discussed etiology pathology conservative versus surgical therapies. At this point we performed a total nail avulsion with matrixectomy second digit left foot after local anesthesia was administered he tolerated this procedure well. Also removed prescription for Ketaconazole.Marland Kitchen He also has a prescription for Cortisporin Otic which we will apply twice daily after soaking twice daily Betadine in warm water. I will follow-up with him in a week. At which time I will also see his wife.  Roselind Messier DPM

## 2015-09-24 ENCOUNTER — Encounter: Payer: Self-pay | Admitting: Podiatry

## 2015-09-24 ENCOUNTER — Ambulatory Visit (INDEPENDENT_AMBULATORY_CARE_PROVIDER_SITE_OTHER): Payer: Medicare Other | Admitting: Podiatry

## 2015-09-24 DIAGNOSIS — L600 Ingrowing nail: Secondary | ICD-10-CM

## 2015-09-24 NOTE — Patient Instructions (Signed)

## 2015-09-24 NOTE — Progress Notes (Signed)
He presents today for follow-up of nail avulsion second digit left. He states it is doing great I have no problems continues to soak twice daily Betadine and water and apply Cortisporin otic twice daily as directed.  Objective: Vital signs are stable he is alert and oriented 3. Pulses are strongly palpable. Second digit of left foot demonstrates well-healing nail bed with no erythema and no cellulitis drainage or odor granulation tissue is present and epithelialization is occurring.  Assessment: Well-healing surgical toe second digit left foot.  Plan: Discontinue Betadine in warm water start with Epsom salts and warm water twice daily. Continue the use of the Cortisporin otic twice daily covered during the daytime and leave open at night time. I instructed him to soak until completely resolved explained to him that this can take as long as 2 or 3 weeks. If he does not completely well in 2-3 weeks or if he has questions or concerns regarding this he should notify us immediately.

## 2016-01-20 ENCOUNTER — Encounter: Payer: Self-pay | Admitting: Cardiovascular Disease

## 2016-03-12 NOTE — Progress Notes (Signed)
Patient ID: Joe [name], male   DOB: 1941-05-01, 75 y.o.   MRN: [id number]  74 y.o.  referred by [facility] initially in 2015  for preop clearance orthopedic surgery Has had scope of both knees and right shoulder with general anesthesia with no issues.  No bleeding diathesis.  No history of chest pain or CAD.  2015  going to gym for light weights and cardio with no issues.  Post left [facility] June 2016 has season tickets to [place]  He is a retired Geologist, engineering  .  Denies sleep apnea despite body habitus  HTN on Rx No history of DVT but mild edema from obesity  No premature family history of sudden death or heart issues.  Quit smoking in 98   [facility]  [address] at gym thought to be postural/vertigo with negative w/u r/o normal telemetry and baseline ECG with RBBB no bradycardia or tachycardia identified Echo with EF 60-65%   Reevaluated for dyspnea 08/2015   ETT normal BNP normal Echo  08/2015   Study Conclusions  - Left ventricle: The cavity size was normal. Systolic function was  normal. The estimated ejection fraction was in the range of 60%  to 65%. Wall motion was normal; there were no regional wall  motion abnormalities. - Aortic valve: Poorly visualized. Moderately thickened, moderately  calcified leaflets. Valve area (VTI): 2.95 cm^2. Valve area  (Vmax): 2.96 cm^2. Valve area (Vmean): 2.81 cm^2. - Tricuspid valve: There was trivial regurgitation. - Pulmonic valve: Poorly visualized. - Pulmonary arteries: PA peak pressure: 33 mm Hg (S).  Traveling a lot with [employer]  Wife has Myesthenia and Dementia and sounds like it is hard for him to deal with   ROS: Denies fever, malais, weight loss, blurry vision, decreased visual acuity, cough, sputum, SOB, hemoptysis, pleuritic pain, palpitaitons, heartburn, abdominal pain, melena, lower extremity edema, claudication, or rash.  All other systems reviewed and negative   General: Affect appropriate Obese white  male  HEENT: normal Neck supple with no adenopathy JVP normal no bruits no thyromegaly Lungs clear with no wheezing and good diaphragmatic motion Heart:  S1/S2 SEM murmur,rub, gallop or click PMI normal Abdomen: benighn, BS positve, no tenderness, no AAA no bruit.  No HSM or HJR Distal pulses intact with no bruits Trace bilateral LE  edema Neuro non-focal Skin warm and dry No muscular weakness  Medications Current Outpatient Prescriptions  Medication Sig Dispense Refill  . aspirin EC 325 MG tablet 1 tab a day for the next 30 days to prevent blood clots 30 tablet 0  . citalopram (CELEXA) 20 MG tablet Take 20 mg by mouth daily.    . diclofenac (VOLTAREN) 75 MG EC tablet Take 75 mg by mouth 2 (two) times daily.  3  . docusate sodium 100 MG CAPS Take 100 mg by mouth 2 (two) times daily. 60 capsule 0  . ketoconazole (NIZORAL) 2 % cream Apply 1 application topically daily. 15 g 0  . lisinopril-hydrochlorothiazide (PRINZIDE,ZESTORETIC) 20-12.5 MG per tablet Take 1 tablet by mouth 2 (two) times daily.     Marland Kitchen loratadine (CLARITIN) 10 MG tablet Take 10 mg by mouth daily as needed for allergies.    Marland Kitchen NEOMYCIN-POLYMYXIN-HYDROCORTISONE (CORTISPORIN) 1 % SOLN otic solution Apply 1-2 drops to toe BID after soaking 10 mL 1  . simvastatin (ZOCOR) 20 MG tablet Take 20 mg by mouth daily.    . Tamsulosin HCl (FLOMAX) 0.4 MG CAPS Take 0.4 mg by mouth daily.    Marland Kitchen  traZODone (DESYREL) 50 MG tablet Take 50 mg by mouth at bedtime as needed. sleep  3  . verapamil (CALAN-SR) 120 MG CR tablet Take 120 mg by mouth daily.     No current facility-administered medications for this visit.    Allergies Demerol; Meloxicam; Keflex; and Penicillins  Family History: Family History  Problem Relation Age of Onset  . Diabetes Maternal Grandmother   . Heart attack Brother   . Heart attack Father   . Hypertension Father   . Heart disease Mother   . Hypertension Mother     Social History: Social History    Social History  . Marital Status: Married    Spouse Name: N/A  . Number of Children: N/A  . Years of Education: N/A   Occupational History  . Not on file.   Social History Main Topics  . Smoking status: Former Smoker    Quit date: 10/19/1996  . Smokeless tobacco: Never Used  . Alcohol Use: Yes     Comment: occassional  . Drug Use: No  . Sexual Activity: Not on file   Other Topics Concern  . Not on file   Social History Narrative    Electrocardiogram: 4/14 SR RBBB rate 96 01/2013   06/2014  SR rate 67 LAD RBBB no sig change   08/21/15  SR rate 71  LVH  Assessment and Plan Syncope: resolved no recurrence HTN: Well controlled.  Continue current medications and low sodium Dash type diet.   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

## 2016-03-13 ENCOUNTER — Encounter: Payer: Self-pay | Admitting: Cardiovascular Disease

## 2016-03-13 ENCOUNTER — Ambulatory Visit (INDEPENDENT_AMBULATORY_CARE_PROVIDER_SITE_OTHER): Payer: Medicare Other | Admitting: Cardiovascular Disease

## 2016-03-13 VITALS — BP 118/54 | HR 68 | Ht 67.0 in | Wt 233.4 lb

## 2016-03-13 DIAGNOSIS — R55 Syncope and collapse: Secondary | ICD-10-CM

## 2016-03-13 NOTE — Patient Instructions (Addendum)

## 2016-07-07 ENCOUNTER — Encounter: Payer: Self-pay | Admitting: Cardiovascular Disease

## 2016-07-16 NOTE — Progress Notes (Signed)
Patient ID: Joe [name], male   DOB: 1940/11/30, 75 y.o.   MRN: [id number]  74 y.o.  referred by [facility] initially in 2015  for preop clearance orthopedic surgery Has had scope of both knees and right shoulder with general anesthesia with no issues.  No bleeding diathesis.  No history of chest pain or CAD.  2015  going to gym for light weights and cardio with no issues.  Post left [facility] June 2016 has season tickets to [place]  He is a retired Geologist, engineering  .  Denies sleep apnea despite body habitus  HTN on Rx No history of DVT but mild edema from obesity  No premature family history of sudden death or heart issues.  Quit smoking in 98   [facility]"  [address] at gym thought to be postural/vertigo with negative w/u r/o normal telemetry and baseline ECG with RBBB no bradycardia or tachycardia identified Echo with EF 60-65%   Reevaluated for dyspnea 08/2015   ETT normal BNP normal Echo  08/2015   Study Conclusions  - Left ventricle: The cavity size was normal. Systolic function was  normal. The estimated ejection fraction was in the range of 60%  to 65%. Wall motion was normal; there were no regional wall  motion abnormalities. - Aortic valve: Poorly visualized. Moderately thickened, moderately  calcified leaflets. Valve area (VTI): 2.95 cm^2. Valve area  (Vmax): 2.96 cm^2. Valve area (Vmean): 2.81 cm^2. - Tricuspid valve: There was trivial regurgitation. - Pulmonic valve: Poorly visualized. - Pulmonary arteries: PA peak pressure: 33 mm Hg (S).  Traveling a lot with [employer]  Wife has Myesthenia and Dementia and sounds like it is hard for him to deal with   ROS: Denies fever, malais, weight loss, blurry vision, decreased visual acuity, cough, sputum, SOB, hemoptysis, pleuritic pain, palpitaitons, heartburn, abdominal pain, melena, lower extremity edema, claudication, or rash.  All other systems reviewed and negative   General: Affect appropriate Obese white  male  HEENT: normal Neck supple with no adenopathy JVP normal no bruits no thyromegaly Lungs clear with no wheezing and good diaphragmatic motion Heart:  S1/S2 SEM murmur,rub, gallop or click PMI normal Abdomen: benighn, BS positve, no tenderness, no AAA no bruit.  No HSM or HJR Distal pulses intact with no bruits Trace bilateral LE  edema Neuro non-focal Skin warm and dry No muscular weakness  Medications Current Outpatient Prescriptions  Medication Sig Dispense Refill  . aspirin EC 81 MG tablet Take 81 mg by mouth daily.    . citalopram (CELEXA) 20 MG tablet Take 20 mg by mouth daily.    Marland Kitchen docusate sodium 100 MG CAPS Take 100 mg by mouth 2 (two) times daily. 60 capsule 0  . IRON PO Take 1 tablet by mouth daily.    Marland Kitchen lisinopril-hydrochlorothiazide (PRINZIDE,ZESTORETIC) 20-12.5 MG per tablet Take 1 tablet by mouth 2 (two) times daily.     Marland Kitchen loratadine (CLARITIN) 10 MG tablet Take 10 mg by mouth daily as needed for allergies.    . Multiple Vitamin (MV-ONE) CAPS Take 1 capsule by mouth daily.    . Omega-3 Fatty Acids (FISH OIL PO) Take 1 tablet by mouth daily.    . simvastatin (ZOCOR) 20 MG tablet Take 20 mg by mouth daily.    . Tamsulosin HCl (FLOMAX) 0.4 MG CAPS Take 0.4 mg by mouth daily.    . traZODone (DESYREL) 50 MG tablet Take 50 mg by mouth at bedtime as needed. sleep  3  .  verapamil (CALAN-SR) 120 MG CR tablet Take 120 mg by mouth 3 (three) times daily.      No current facility-administered medications for this visit.     Allergies Demerol [meperidine]; Meloxicam; Keflex [cephalexin]; and Penicillins  Family History: Family History  Problem Relation Age of Onset  . Diabetes Maternal Grandmother   . Heart attack Father   . Hypertension Father   . Heart disease Mother   . Hypertension Mother   . Heart attack Brother     Social History: Social History   Social History  . Marital status: Married    Spouse name: N/A  . Number of children: N/A  . Years of  education: N/A   Occupational History  . Not on file.   Social History Main Topics  . Smoking status: Former Smoker    Quit date: 10/19/1996  . Smokeless tobacco: Never Used  . Alcohol use Yes     Comment: occassional  . Drug use: No  . Sexual activity: Not on file   Other Topics Concern  . Not on file   Social History Narrative  . No narrative on file    Electrocardiogram: 4/14 SR RBBB rate 96 01/2013   06/2014  SR rate 67 LAD RBBB no sig change   08/21/15  SR rate 71  LVH  07/21/16  SR rate 55 ICRBBB otherwise normal   Assessment and Plan Syncope: resolved no recurrence HTN: Well controlled.  Continue current medications and low sodium Dash type diet.   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

## 2016-07-21 ENCOUNTER — Encounter: Payer: Self-pay | Admitting: Cardiovascular Disease

## 2016-07-21 ENCOUNTER — Ambulatory Visit (INDEPENDENT_AMBULATORY_CARE_PROVIDER_SITE_OTHER): Payer: Medicare Other | Admitting: Cardiovascular Disease

## 2016-07-21 ENCOUNTER — Encounter (INDEPENDENT_AMBULATORY_CARE_PROVIDER_SITE_OTHER): Payer: Self-pay

## 2016-07-21 VITALS — BP 158/62 | HR 54 | Ht 67.0 in | Wt 252.0 lb

## 2016-07-21 DIAGNOSIS — I10 Essential (primary) hypertension: Secondary | ICD-10-CM

## 2016-07-21 DIAGNOSIS — R0600 Dyspnea, unspecified: Secondary | ICD-10-CM

## 2016-07-21 NOTE — Patient Instructions (Addendum)
Medication Instructions:  Your physician recommends that you continue on your current medications as directed. Please refer to the Current Medication list given to you today.  Labwork: NONE  Testing/Procedures: Your physician has requested that you have an echocardiogram in 1 year. Echocardiography is a painless test that uses sound waves to create images of your heart. It provides your doctor with information about the size and shape of your heart and how well your heart's chambers and valves are working. This procedure takes approximately one hour. There are no restrictions for this procedure.  Follow-Up: Your physician wants you to follow-up in: 1 year with Dr. Johnsie Cancel. You will receive a reminder letter in the mail two months in advance. If you don't receive a letter, please call our office to schedule the follow-up appointment.   If you need a refill on your cardiac medications before your next appointment, please call your pharmacy.

## 2017-04-27 NOTE — Progress Notes (Signed)
Cardiology Office Note    Date:  04/28/2017   ID:  Joe [name], DOB April 15, 1941, MRN [id number]  PCP:  [name], MD  Cardiologist:  Dr. Johnsie Cancel   CC: skipped beats during platelet donation  History of Present Illness:  Joe [name] is a 76 y.o. male with a history of HTN, obesity, HLD, OSA on CPAP, RBBB, previous syncope and dyspnea who presents to clinic for follow up.   He was seen at [facility] 4/14 for "syncope."  [address] [address] at gym thought to be postural/vertigo with negative w/u r/o normal telemetry and baseline ECG with RBBB. No bradycardia or tachycardia was identified. Echo with EF 60-65%. He was reevaluated for dyspnea in 08/2015. BNP was normal, ETT was normal and repeat 2D ECHO showed normal LV EF with aortic valve sclerosis with no stenosis. Last seen by Dr. Johnsie Cancel in 07/2016 and doing well. Plan was for repeat echo and follow up in 1 year.   Today he presents to clinic for follow up. He routinely donates platelets but the last two times they wouldn't let him donate because of an irregular heart beat that was discovered on manual palpation. He did not have any symptoms associated with this, although sometimes he gets palpations. He also has had worsening of his chronic dyspnea, a 20 lbs unexplained weight gain and occasional LE edema. No orthopnea or PND. No dizziness or syncope. No blood in his stool or urine. No chest pain.    Past Medical History:  Diagnosis Date  . Depression   . Hyperlipidemia   . Hypertension   . Primary localized osteoarthritis of left knee   . Primary localized osteoarthritis of right knee   . Shortness of breath dyspnea   . Sleep apnea    CPAP  . Syncope 12/11/2012    Past Surgical History:  Procedure Laterality Date  . EYE SURGERY Left prosthesis  . KNEE SURGERY Bilateral   . 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 ROTATOR CUFF REPAIR    . TOTAL KNEE ARTHROPLASTY Right 09/17/2014   Procedure: RIGHT TOTAL KNEE ARTHROPLASTY Steroid  injection LEFT KNEE;  Surgeon: [name], MD;  Location: [place];  Service: Orthopedics;  Laterality: Right;  . TOTAL KNEE ARTHROPLASTY Left 03/25/2015   Procedure: TOTAL KNEE ARTHROPLASTY;  Surgeon: [name], MD;  Location: [place];  Service: Orthopedics;  Laterality: Left;    Current Medications: Outpatient Medications Prior to Visit  Medication Sig Dispense Refill  . aspirin EC 81 MG tablet Take 81 mg by mouth daily.    . citalopram (CELEXA) 20 MG tablet Take 20 mg by mouth daily.    Marland Kitchen docusate sodium 100 MG CAPS Take 100 mg by mouth 2 (two) times daily. 60 capsule 0  . IRON PO Take 1 tablet by mouth daily.    Marland Kitchen lisinopril-hydrochlorothiazide (PRINZIDE,ZESTORETIC) 20-12.5 MG per tablet Take 1 tablet by mouth 2 (two) times daily.     Marland Kitchen loratadine (CLARITIN) 10 MG tablet Take 10 mg by mouth daily as needed for allergies.    . Multiple Vitamin (MV-ONE) CAPS Take 1 capsule by mouth daily.    . Omega-3 Fatty Acids (FISH OIL PO) Take 1 tablet by mouth daily.    . simvastatin (ZOCOR) 20 MG tablet Take 20 mg by mouth daily.    . Tamsulosin HCl (FLOMAX) 0.4 MG CAPS Take 0.4 mg by mouth daily.    . traZODone (DESYREL) 50 MG tablet Take 50 mg  by mouth at bedtime as needed. sleep  3  . verapamil (CALAN-SR) 120 MG CR tablet Take 120 mg by mouth 3 (three) times daily.      No facility-administered medications prior to visit.      Allergies:   Demerol [meperidine]; Meloxicam; Keflex [cephalexin]; and Penicillins   Social History   Social History  . Marital status: Married    Spouse name: N/A  . Number of children: N/A  . Years of education: N/A   Social History Main Topics  . Smoking status: Former Smoker    Quit date: 10/19/1996  . Smokeless tobacco: Never Used  . Alcohol use Yes     Comment: occassional  . Drug use: No  . Sexual activity: Not Asked   Other Topics Concern  . None   Social History Narrative  . None     Family History:  The patient's family history includes  Diabetes in his maternal grandmother; Heart attack in his brother and father; Heart disease in his mother; Hypertension in his father and mother.      ROS:   Please see the history of present illness.    ROS All other systems reviewed and are negative.   PHYSICAL EXAM:   VS:  BP 140/80   Pulse 71   Ht 5\' 7"  (1.702 m)   Wt 286 lb 8 oz (130 kg)   SpO2 95%   BMI 44.87 kg/m    GEN: Well nourished, well developed, in no acute distress, morbidly obese HEENT: normal  Neck: no JVD, carotid bruits, or masses Cardiac: RRR; + systolic murmur, rubs, or gallops,1+ bilateral LE edema  Respiratory:  clear to auscultation bilaterally, normal work of breathing GI: soft, nontender, nondistended, + BS MS: no deformity or atrophy  Skin: warm and dry, no rash Neuro:  Alert and Oriented x 3, Strength and sensation are intact Psych: euthymic mood, full affec    Wt Readings from Last 3 Encounters:  04/28/17 286 lb 8 oz (130 kg)  07/21/16 252 lb (114.3 kg)  03/13/16 233 lb 6.4 oz (105.9 kg)      Studies/Labs Reviewed:   EKG:  EKG is NOT ordered today.  This demonstrates sinus brady with RBBB  Recent Labs: No results found for requested labs within last 8760 hours.   Lipid Panel No results found for: CHOL, TRIG, HDL, CHOLHDL, VLDL, LDLCALC, LDLDIRECT  Additional studies/ records that were reviewed today include:  2D ECHO: 09/09/2015 LV EF: 60% -   65% Study Conclusions - Left ventricle: The cavity size was normal. Systolic function was   normal. The estimated ejection fraction was in the range of 60%   to 65%. Wall motion was normal; there were no regional wall   motion abnormalities. - Aortic valve: Poorly visualized. Moderately thickened, moderately   calcified leaflets. Valve area (VTI): 2.95 cm^2. Valve area   (Vmax): 2.96 cm^2. Valve area (Vmean): 2.81 cm^2. - Tricuspid valve: There was trivial regurgitation. - Pulmonic valve: Poorly visualized. - Pulmonary arteries: PA peak  pressure: 33 mm Hg (S).  GXT 09/09/15 Study Highlights  Blood pressure demonstrated a hypertensive response to exercise.  There was no ST segment deviation noted during stress.       ASSESSMENT & PLAN:   Dyspnea: recent worsening of his chronic dyspnea. He has also had an unexplained 20-30 lbs weight gain and some LE edema. His lungs sound clear and JVD not elevated, but difficult to assess with body habitus. Will check an echo and  BNP. We may need to switch him to a stronger diuretic (currenlty on Prinzide 20-12.5mg  daily)  RBBB: stable  AV sclerosis: update 2D ECHO echo as above  Irregular heart beat: noted on manual palpations during platelet donation. Will place a 30 day event monitor. He certainly has RFs for afib given morbid obesity and OSA   HTN: BP borderline today. Continue to monitor.   Weight gain: will order a TSH  OSA: continue CPAP  Medication Adjustments/Labs and Tests Ordered: Current medicines are reviewed at length with the patient today.  Concerns regarding medicines are outlined above.  Medication changes, Labs and Tests ordered today are listed in the Patient Instructions below. Patient Instructions  Medication Instructions: Your physician has recommended you make the following change in your medication:  -1)   Labwork: Your physician recommends that you have lab work today: BNP. BMET, TSH   Procedures/Testing: Your physician has recommended that you wear a holter monitor. Holter monitors are medical devices that record the heart's electrical activity. Doctors most often use these monitors to diagnose arrhythmias. Arrhythmias are problems with the speed or rhythm of the heartbeat. The monitor is a small, portable device. You can wear one while you do your normal daily activities. This is usually used to diagnose what is causing palpitations/syncope (passing out).    Follow-Up: Your physician recommends that you schedule a follow-up appointment in     Any Additional Special Instructions Will Be Listed Below (If Applicable).     If you need a refill on your cardiac medications before your next appointment, please call your pharmacy.      Signed, Angelena Form, PA-C  04/28/2017 2:10 PM    [facility] [address], [place], [place]  [address] Phone: [phone]; Fax: [phone]

## 2017-04-28 ENCOUNTER — Encounter: Payer: Self-pay | Admitting: Physician Assistant

## 2017-04-28 ENCOUNTER — Ambulatory Visit (INDEPENDENT_AMBULATORY_CARE_PROVIDER_SITE_OTHER): Payer: Medicare Other | Admitting: Physician Assistant

## 2017-04-28 VITALS — BP 140/80 | HR 71 | Ht 67.0 in | Wt 286.5 lb

## 2017-04-28 DIAGNOSIS — R0600 Dyspnea, unspecified: Secondary | ICD-10-CM | POA: Diagnosis not present

## 2017-04-28 DIAGNOSIS — Z9989 Dependence on other enabling machines and devices: Secondary | ICD-10-CM

## 2017-04-28 DIAGNOSIS — G4733 Obstructive sleep apnea (adult) (pediatric): Secondary | ICD-10-CM | POA: Diagnosis not present

## 2017-04-28 DIAGNOSIS — I4510 Unspecified right bundle-branch block: Secondary | ICD-10-CM

## 2017-04-28 DIAGNOSIS — I10 Essential (primary) hypertension: Secondary | ICD-10-CM | POA: Diagnosis not present

## 2017-04-28 DIAGNOSIS — I358 Other nonrheumatic aortic valve disorders: Secondary | ICD-10-CM

## 2017-04-28 DIAGNOSIS — I499 Cardiac arrhythmia, unspecified: Secondary | ICD-10-CM

## 2017-04-28 NOTE — Patient Instructions (Addendum)
Medication Instructions: Your physician recommends that you continue on your current medications as directed. Please refer to the Current Medication list given to you today.  Labwork: Your physician recommends that you have lab work today: BNP, BMET, TSH  Procedures/Testing: Your physician has recommended that you wear a 30 day holter monitor. Holter monitors are medical devices that record the heart's electrical activity. Doctors most often use these monitors to diagnose arrhythmias. Arrhythmias are problems with the speed or rhythm of the heartbeat. The monitor is a small, portable device. You can wear one while you do your normal daily activities. This is usually used to diagnose what is causing palpitations/syncope (passing out).  Your physician has requested that you have an echocardiogram. Echocardiography is a painless test that uses sound waves to create images of your heart. It provides your doctor with information about the size and shape of your heart and how well your heart's chambers and valves are working. This procedure takes approximately one hour. There are no restrictions for this procedure.   Follow-Up: Your physician recommends that you schedule a follow-up appointment in 3 MONTHS with Dr. Johnsie Cancel.   Any Additional Special Instructions Will Be Listed Below (If Applicable).     If you need a refill on your cardiac medications before your next appointment, please call your pharmacy.

## 2017-04-29 ENCOUNTER — Telehealth: Payer: Self-pay | Admitting: *Deleted

## 2017-04-29 DIAGNOSIS — Z79899 Other long term (current) drug therapy: Secondary | ICD-10-CM

## 2017-04-29 LAB — BASIC METABOLIC PANEL
BUN/Creatinine Ratio: 19 (ref 10–24)
BUN: 18 mg/dL (ref 8–27)
CO2: 25 mmol/L (ref 20–29)
CREATININE: 0.94 mg/dL (ref 0.76–1.27)
Calcium: 9.2 mg/dL (ref 8.6–10.2)
Chloride: 103 mmol/L (ref 96–106)
GFR calc Af Amer: 92 mL/min/{1.73_m2} (ref >59)
GFR calc non Af Amer: 80 mL/min/{1.73_m2} (ref >59)
GLUCOSE: 85 mg/dL (ref 65–99)
POTASSIUM: 4.2 mmol/L (ref 3.5–5.2)
SODIUM: 141 mmol/L (ref 134–144)

## 2017-04-29 LAB — TSH: TSH: 0.763 u[IU]/mL (ref 0.450–4.500)

## 2017-04-29 LAB — PRO B NATRIURETIC PEPTIDE: NT-PRO BNP: 452 pg/mL — AB (ref 0–376)

## 2017-04-29 MED ORDER — LISINOPRIL 20 MG PO TABS: 20.0000 mg | ORAL_TABLET | Freq: Every day | ORAL | 3 refills | Status: DC

## 2017-04-29 MED ORDER — FUROSEMIDE 40 MG PO TABS: 40.0000 mg | ORAL_TABLET | Freq: Every day | ORAL | 1 refills | Status: DC

## 2017-04-29 NOTE — Addendum Note (Signed)
Addended by: Gaetano Net on: 04/29/2017 02:30 PM   Modules accepted: Orders

## 2017-04-29 NOTE — Telephone Encounter (Signed)
-----   Message from Eileen Stanford, PA-C sent at 04/29/2017  8:06 AM EDT ----- BNP mildly elevated but can sometimes be falsely low in bigger folks. Lets stop Prinzide 20-12.5mg  daily and start lisinopril 20mg  daily and lasix 40mg  daily. We will need to recheck a BMET in 1 week. I want to see if this helps remove some fluid and improve breathing

## 2017-05-07 ENCOUNTER — Other Ambulatory Visit: Payer: Medicare Other | Admitting: *Deleted

## 2017-05-07 DIAGNOSIS — Z79899 Other long term (current) drug therapy: Secondary | ICD-10-CM

## 2017-05-07 LAB — BASIC METABOLIC PANEL
BUN / CREAT RATIO: 25 — AB (ref 10–24)
BUN: 26 mg/dL (ref 8–27)
CALCIUM: 9.7 mg/dL (ref 8.6–10.2)
CHLORIDE: 99 mmol/L (ref 96–106)
CO2: 26 mmol/L (ref 20–29)
Creatinine, Ser: 1.03 mg/dL (ref 0.76–1.27)
GFR, EST AFRICAN AMERICAN: 82 mL/min/{1.73_m2} (ref >59)
GFR, EST NON AFRICAN AMERICAN: 71 mL/min/{1.73_m2} (ref >59)
Glucose: 115 mg/dL — ABNORMAL HIGH (ref 65–99)
POTASSIUM: 4 mmol/L (ref 3.5–5.2)
SODIUM: 143 mmol/L (ref 134–144)

## 2017-05-10 ENCOUNTER — Other Ambulatory Visit: Payer: Self-pay | Admitting: Physician Assistant

## 2017-05-10 ENCOUNTER — Ambulatory Visit (HOSPITAL_COMMUNITY): Payer: Medicare Other | Attending: Cardiovascular Disease

## 2017-05-10 ENCOUNTER — Other Ambulatory Visit: Payer: Self-pay

## 2017-05-10 ENCOUNTER — Ambulatory Visit (INDEPENDENT_AMBULATORY_CARE_PROVIDER_SITE_OTHER): Payer: Medicare Other

## 2017-05-10 ENCOUNTER — Telehealth: Payer: Self-pay | Admitting: *Deleted

## 2017-05-10 DIAGNOSIS — R002 Palpitations: Secondary | ICD-10-CM

## 2017-05-10 DIAGNOSIS — I340 Nonrheumatic mitral (valve) insufficiency: Secondary | ICD-10-CM | POA: Diagnosis not present

## 2017-05-10 DIAGNOSIS — I499 Cardiac arrhythmia, unspecified: Secondary | ICD-10-CM

## 2017-05-10 DIAGNOSIS — R0600 Dyspnea, unspecified: Secondary | ICD-10-CM | POA: Diagnosis present

## 2017-05-10 NOTE — Telephone Encounter (Signed)
Received call from Lelan Pons at [place]:  Baseline recording done at 1:19 PM CST--atrial fibrillation 110 BPM, follow-up recording at 2:29 PM CST atrial fibrillation 110 BPM.

## 2017-05-10 NOTE — Telephone Encounter (Signed)
Dr Tamala Julian reviewed--atrial fibrillation, HR 110, needs office visit within 24 hours. Pt is aware he has been scheduled to see Roderic Palau, NP in the [facility] 05/11/17 at 3 PM.

## 2017-05-11 ENCOUNTER — Telehealth: Payer: Self-pay | Admitting: Cardiovascular Disease

## 2017-05-11 ENCOUNTER — Ambulatory Visit (HOSPITAL_COMMUNITY)
Admission: RE | Admit: 2017-05-11 | Discharge: 2017-05-11 | Disposition: A | Discharge status: Home / Self Care | Payer: Medicare Other | Source: Ambulatory Visit | Attending: Nurse Practitioner | Admitting: Nurse Practitioner

## 2017-05-11 ENCOUNTER — Encounter (HOSPITAL_COMMUNITY): Payer: Self-pay | Admitting: Nurse Practitioner

## 2017-05-11 VITALS — BP 180/84 | HR 63 | Ht 67.0 in | Wt 289.4 lb

## 2017-05-11 DIAGNOSIS — Z880 Allergy status to penicillin: Secondary | ICD-10-CM | POA: Insufficient documentation

## 2017-05-11 DIAGNOSIS — Z833 Family history of diabetes mellitus: Secondary | ICD-10-CM | POA: Insufficient documentation

## 2017-05-11 DIAGNOSIS — I4891 Unspecified atrial fibrillation: Secondary | ICD-10-CM | POA: Insufficient documentation

## 2017-05-11 DIAGNOSIS — I10 Essential (primary) hypertension: Secondary | ICD-10-CM | POA: Insufficient documentation

## 2017-05-11 DIAGNOSIS — I4510 Unspecified right bundle-branch block: Secondary | ICD-10-CM | POA: Insufficient documentation

## 2017-05-11 DIAGNOSIS — E785 Hyperlipidemia, unspecified: Secondary | ICD-10-CM | POA: Insufficient documentation

## 2017-05-11 DIAGNOSIS — Z8249 Family history of ischemic heart disease and other diseases of the circulatory system: Secondary | ICD-10-CM | POA: Insufficient documentation

## 2017-05-11 DIAGNOSIS — Z79899 Other long term (current) drug therapy: Secondary | ICD-10-CM | POA: Diagnosis not present

## 2017-05-11 DIAGNOSIS — Z888 Allergy status to other drugs, medicaments and biological substances status: Secondary | ICD-10-CM | POA: Insufficient documentation

## 2017-05-11 DIAGNOSIS — F329 Major depressive disorder, single episode, unspecified: Secondary | ICD-10-CM | POA: Insufficient documentation

## 2017-05-11 DIAGNOSIS — Z7901 Long term (current) use of anticoagulants: Secondary | ICD-10-CM | POA: Insufficient documentation

## 2017-05-11 DIAGNOSIS — Z87891 Personal history of nicotine dependence: Secondary | ICD-10-CM | POA: Insufficient documentation

## 2017-05-11 DIAGNOSIS — Z96653 Presence of artificial knee joint, bilateral: Secondary | ICD-10-CM | POA: Insufficient documentation

## 2017-05-11 DIAGNOSIS — G4733 Obstructive sleep apnea (adult) (pediatric): Secondary | ICD-10-CM | POA: Insufficient documentation

## 2017-05-11 DIAGNOSIS — E669 Obesity, unspecified: Secondary | ICD-10-CM | POA: Insufficient documentation

## 2017-05-11 LAB — CBC
HEMATOCRIT: 41.1 % (ref 39.0–52.0)
Hemoglobin: 13.1 g/dL (ref 13.0–17.0)
MCH: 26.2 pg (ref 26.0–34.0)
MCHC: 31.9 g/dL (ref 30.0–36.0)
MCV: 82.2 fL (ref 78.0–100.0)
Platelets: 131 10*3/uL — ABNORMAL LOW (ref 150–400)
RBC: 5 MIL/uL (ref 4.22–5.81)
RDW: 14.3 % (ref 11.5–15.5)
WBC: 9.7 10*3/uL (ref 4.0–10.5)

## 2017-05-11 MED ORDER — FUROSEMIDE 40 MG PO TABS: 40.0000 mg | ORAL_TABLET | Freq: Two times a day (BID) | ORAL | 1 refills | Status: DC

## 2017-05-11 MED ORDER — RIVAROXABAN 20 MG PO TABS: 20.0000 mg | ORAL_TABLET | Freq: Every day | ORAL | 0 refills | Status: DC

## 2017-05-11 NOTE — Telephone Encounter (Signed)
New Message ° ° pt verbalized that he is returning call for rn  °

## 2017-05-11 NOTE — Telephone Encounter (Signed)
Patient calling for echo results. Patient aware of echo results as written below.  Notes recorded by Leanor Kail, PA on 05/10/2017 at 10:16 PM EDT Pumping function of heart higher normal. Mild aortic stenosis and mitral regur. Mild pulmonary hypertension. Continue current regimen.

## 2017-05-11 NOTE — Patient Instructions (Signed)
Your physician has recommended you make the following change in your medication:  1)Stop aspirin 2)Stop voltaren 3)Start Xarelto 20mg  once a day with supper. 4)Increase Lasix to 40mg  TWICE A DAY

## 2017-05-12 NOTE — Progress Notes (Signed)
Primary Care Physician: [name], MD Cardiologist: Dr. Johnsie Cancel Referring provider: [facility] Triage   Joe [name] is a 76 y.o. male with a h/o HTN, obesity,OSA treated with cpap, RBBB, remote syncopal episode, with new onset afib found on event monitor that was just placed, in the afib clinic for f/u for this finding. Pt has not felt well for several months and had noticed increased swelling in his lower extremities and weight gain. He gives blood on a regular basis and on 2 times in June, he was not allowed to give due to irregular HB found on work up. He was able to give this past Sunday. He gives blood around 2x a month.  He saw [name], [place] with c/o shortness of breath and LLE, reports of irregular HB. She placed an event monitor, repeated Echo, drew a BNP, Bmet and TSH, stopped HCTZ and started lasix 40 mg a day. Repeat Bmet on lasix ok, however pt has not lost any weight on lasix, but has gained 3 more lbs. Since October, he has gained 34 lbs. He has significant abdominal girth and LLE. Repeat echo showed EF 65-70% with mild MR, mild Aortic stenosis, elevated rt side pressure at 35 mm. He states that he is using CPAP on a regular basis with a recent new machine followed by Dr. Maxwell Caul.  He denies any alcohol, tobacco, excessive caffeine. He tries to go to gym and water walk as he has knee issues, prior replacement that limit walking.  Today, he denies symptoms of palpitations, chest pain,  orthopnea, PND, dizziness, presyncope, syncope, or neurologic sequela. Positive for weight gain, LLE, shortness of breath. The patient is tolerating medications without difficulties and is otherwise without complaint today.   Past Medical History:  Diagnosis Date  . Depression   . Hyperlipidemia   . Hypertension   . Primary localized osteoarthritis of left knee   . Primary localized osteoarthritis of right knee   . Shortness of breath dyspnea   . Sleep apnea    CPAP  . Syncope  12/11/2012   Past Surgical History:  Procedure Laterality Date  . EYE SURGERY Left prosthesis  . KNEE SURGERY Bilateral   . 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 ROTATOR CUFF REPAIR    . TOTAL KNEE ARTHROPLASTY Right 09/17/2014   Procedure: RIGHT TOTAL KNEE ARTHROPLASTY Steroid injection LEFT KNEE;  Surgeon: [name], MD;  Location: [place];  Service: Orthopedics;  Laterality: Right;  . TOTAL KNEE ARTHROPLASTY Left 03/25/2015   Procedure: TOTAL KNEE ARTHROPLASTY;  Surgeon: [name], MD;  Location: [place];  Service: Orthopedics;  Laterality: Left;    Current Outpatient Prescriptions  Medication Sig Dispense Refill  . citalopram (CELEXA) 20 MG tablet Take 20 mg by mouth daily.    Marland Kitchen docusate sodium 100 MG CAPS Take 100 mg by mouth 2 (two) times daily. 60 capsule 0  . furosemide (LASIX) 40 MG tablet Take 1 tablet (40 mg total) by mouth 2 (two) times daily. 90 tablet 1  . gabapentin (NEURONTIN) 300 MG capsule Take 300 mg by mouth 3 (three) times daily.    . IRON PO Take 1 tablet by mouth daily.    Marland Kitchen lisinopril (PRINIVIL,ZESTRIL) 20 MG tablet Take 1 tablet (20 mg total) by mouth daily. 90 tablet 3  . loratadine (CLARITIN) 10 MG tablet Take 10 mg by mouth daily as needed for allergies.    . Multiple Vitamin (MV-ONE) CAPS Take  1 capsule by mouth daily.    . Omega-3 Fatty Acids (FISH OIL PO) Take 1 tablet by mouth daily.    . simvastatin (ZOCOR) 20 MG tablet Take 20 mg by mouth daily.    . Tamsulosin HCl (FLOMAX) 0.4 MG CAPS Take 0.4 mg by mouth daily.    . traZODone (DESYREL) 50 MG tablet Take 50 mg by mouth at bedtime as needed. sleep  3  . verapamil (CALAN-SR) 120 MG CR tablet Take 120 mg by mouth 3 (three) times daily.     . rivaroxaban (XARELTO) 20 MG TABS tablet Take 1 tablet (20 mg total) by mouth daily with supper. 30 tablet 0   No current facility-administered medications for this encounter.     Allergies  Allergen Reactions  . Demerol [Meperidine] Nausea And  Vomiting and Rash  . Meloxicam     Increases blood pressure  . Keflex [Cephalexin] Rash  . Penicillins Rash    Social History   Social History  . Marital status: Married    Spouse name: N/A  . Number of children: N/A  . Years of education: N/A   Occupational History  . Not on file.   Social History Main Topics  . Smoking status: Former Smoker    Quit date: 10/19/1996  . Smokeless tobacco: Never Used  . Alcohol use Yes     Comment: occassional  . Drug use: No  . Sexual activity: Not on file   Other Topics Concern  . Not on file   Social History Narrative  . No narrative on file    Family History  Problem Relation Age of Onset  . Diabetes Maternal Grandmother   . Heart attack Father   . Hypertension Father   . Heart disease Mother   . Hypertension Mother   . Heart attack Brother     ROS- All systems are reviewed and negative except as per the HPI above  Physical Exam: Vitals:   05/11/17 1505  BP: (!) 180/84  Pulse: 63  Weight: 289 lb 6.4 oz (131.3 kg)  Height: 5\' 7"  (1.702 m)   Wt Readings from Last 3 Encounters:  05/11/17 289 lb 6.4 oz (131.3 kg)  04/28/17 286 lb 8 oz (130 kg)  07/21/16 252 lb (114.3 kg)    Labs: Lab Results  Component Value Date   NA 143 05/07/2017   K 4.0 05/07/2017   CL 99 05/07/2017   CO2 26 05/07/2017   GLUCOSE 115 (H) 05/07/2017   BUN 26 05/07/2017   CREATININE 1.03 05/07/2017   CALCIUM 9.7 05/07/2017   Lab Results  Component Value Date   INR 1.16 03/15/2015   No results found for: CHOL, HDL, LDLCALC, TRIG   BNP -452 TSH- 0.763  GEN- The patient is morbidly obese, well appearing, alert and oriented x 3 today.   Head- normocephalic, atraumatic Eyes-  Sclera clear, conjunctiva pink Ears- hearing intact Oropharynx- clear Neck- supple, no JVP Lymph- no cervical lymphadenopathy Lungs- Clear to ausculation bilaterally, normal work of breathing Heart- Regular rate and rhythm, no murmurs, rubs or gallops, PMI not  laterally displaced GI- soft, NT, ND, + BS Extremities- no clubbing, cyanosis, LE very taunt and shiny, hard to pit MS- no significant deformity or atrophy Skin- no rash or lesion Psych- euthymic mood, full affect Neuro- strength and sensation are intact  EKG-NSR with IRBBB, pr int 154 ms, qrs int 116 ms, qtc 456 ms Rhythm strips reviewed ,afib with controlled rate Echo-Study Conclusions  - Left  ventricle: The cavity size was at the upper limits of   normal. Systolic function was vigorous. The estimated ejection   fraction was in the range of 65% to 70%. Wall motion was normal;   there were no regional wall motion abnormalities. Left   ventricular diastolic function parameters were normal. - Aortic valve: Transvalvular velocity was increased more than   expected, due to high cardiac output. There was very mild   stenosis. Valve area (VTI): 2.04 cm^2. Valve area (Vmax): 1.8   cm^2. - Mitral valve: There was mild regurgitation. Valve area by   pressure half-time: 1.73 cm^2. Valve area by continuity equation   (using LVOT flow): 2.4 cm^2. - Left atrium: The atrium was mildly dilated. - Right ventricle: The cavity size was mildly dilated. Wall   thickness was normal. - Right atrium: The atrium was mildly dilated. - Pulmonary arteries: Systolic pressure was mildly increased. PA   peak pressure: 35 mm Hg (S).    Assessment and Plan: 1. New onset afb General education re afib The strips I have reviewed are rate controlled and will continue pt on verapamil 120 mg tid without change. Continue event monitor Chadsvasc score of 2(htn,age, but will be 3 with age 28 in November) Will start xarelto 20 mg daily, wife is also on this drug Denies bleeding risk Bleeding precautions discussed Advised not to give blood on blood thinner Stop asa Do not take NSAIDS   2. Weight gain/LEE Has gained 30+ lbs since last fall Not sure how much is fluid/true weight gain, but he obviously has  very significant abdominal girth and LLE Will increase lasix to 40 mg bid Avoid salt Reduse caloric intake  F/u in one week with repeat bmet Will request f/u with Dr. Johnsie Cancel in a month  Geroge Baseman. Nykeria Mealing, [facility] [address] [place], [place] [address] [phone]

## 2017-05-19 ENCOUNTER — Encounter (HOSPITAL_COMMUNITY): Payer: Self-pay | Admitting: Nurse Practitioner

## 2017-05-19 ENCOUNTER — Ambulatory Visit (HOSPITAL_COMMUNITY)
Admission: RE | Admit: 2017-05-19 | Discharge: 2017-05-19 | Disposition: A | Discharge status: Home / Self Care | Payer: Medicare Other | Source: Ambulatory Visit | Attending: Nurse Practitioner | Admitting: Nurse Practitioner

## 2017-05-19 ENCOUNTER — Other Ambulatory Visit (HOSPITAL_COMMUNITY): Payer: Self-pay | Admitting: *Deleted

## 2017-05-19 VITALS — BP 132/68 | HR 77 | Ht 67.0 in | Wt 280.2 lb

## 2017-05-19 DIAGNOSIS — Z6841 Body Mass Index (BMI) 40.0 and over, adult: Secondary | ICD-10-CM | POA: Insufficient documentation

## 2017-05-19 DIAGNOSIS — M170 Bilateral primary osteoarthritis of knee: Secondary | ICD-10-CM | POA: Insufficient documentation

## 2017-05-19 DIAGNOSIS — Z8249 Family history of ischemic heart disease and other diseases of the circulatory system: Secondary | ICD-10-CM | POA: Diagnosis not present

## 2017-05-19 DIAGNOSIS — E785 Hyperlipidemia, unspecified: Secondary | ICD-10-CM | POA: Insufficient documentation

## 2017-05-19 DIAGNOSIS — Z87891 Personal history of nicotine dependence: Secondary | ICD-10-CM | POA: Insufficient documentation

## 2017-05-19 DIAGNOSIS — R635 Abnormal weight gain: Secondary | ICD-10-CM | POA: Diagnosis not present

## 2017-05-19 DIAGNOSIS — Z79899 Other long term (current) drug therapy: Secondary | ICD-10-CM | POA: Insufficient documentation

## 2017-05-19 DIAGNOSIS — I10 Essential (primary) hypertension: Secondary | ICD-10-CM | POA: Diagnosis not present

## 2017-05-19 DIAGNOSIS — R0602 Shortness of breath: Secondary | ICD-10-CM | POA: Insufficient documentation

## 2017-05-19 DIAGNOSIS — F329 Major depressive disorder, single episode, unspecified: Secondary | ICD-10-CM | POA: Diagnosis not present

## 2017-05-19 DIAGNOSIS — Z888 Allergy status to other drugs, medicaments and biological substances status: Secondary | ICD-10-CM | POA: Insufficient documentation

## 2017-05-19 DIAGNOSIS — I4891 Unspecified atrial fibrillation: Secondary | ICD-10-CM

## 2017-05-19 DIAGNOSIS — Z880 Allergy status to penicillin: Secondary | ICD-10-CM | POA: Insufficient documentation

## 2017-05-19 DIAGNOSIS — Z9889 Other specified postprocedural states: Secondary | ICD-10-CM | POA: Diagnosis not present

## 2017-05-19 DIAGNOSIS — Z833 Family history of diabetes mellitus: Secondary | ICD-10-CM | POA: Insufficient documentation

## 2017-05-19 DIAGNOSIS — Z7901 Long term (current) use of anticoagulants: Secondary | ICD-10-CM | POA: Insufficient documentation

## 2017-05-19 DIAGNOSIS — G4733 Obstructive sleep apnea (adult) (pediatric): Secondary | ICD-10-CM | POA: Diagnosis not present

## 2017-05-19 LAB — BASIC METABOLIC PANEL
Anion gap: 7 (ref 5–15)
BUN: 19 mg/dL (ref 6–20)
CALCIUM: 9 mg/dL (ref 8.9–10.3)
CO2: 28 mmol/L (ref 22–32)
Chloride: 103 mmol/L (ref 101–111)
Creatinine, Ser: 0.95 mg/dL (ref 0.61–1.24)
GFR calc Af Amer: >60 mL/min (ref >60)
GLUCOSE: 123 mg/dL — AB (ref 65–99)
Potassium: 3.5 mmol/L (ref 3.5–5.1)
Sodium: 138 mmol/L (ref 135–145)

## 2017-05-19 MED ORDER — POTASSIUM CHLORIDE CRYS ER 20 MEQ PO TBCR: 20.0000 meq | EXTENDED_RELEASE_TABLET | Freq: Every day | ORAL | 3 refills | Status: DC

## 2017-05-19 NOTE — Progress Notes (Signed)
Primary Care Physician: [name], MD Cardiologist: Dr. Johnsie Cancel Referring provider: [facility] Triage   Joe [name] is a 76 y.o. male with a h/o HTN, obesity,OSA treated with cpap, RBBB, remote syncopal episode, with new onset afib found on event monitor that was just placed, in the afib clinic for f/u for this finding. Pt has not felt well for several months and had noticed increased swelling in his lower extremities and weight gain. He gives blood on a regular basis and on 2 times in June, he was not allowed to give due to irregular HB found on work up. He was able to give this past Sunday. He gives blood around 2x a month.  He saw [name], [place] with c/o shortness of breath and LLE, reports of irregular HB. She placed an event monitor, repeated Echo, drew a BNP, Bmet and TSH, stopped HCTZ and started lasix 40 mg a day. Repeat Bmet on lasix ok, however pt has not lost any weight on lasix, but has gained 3 more lbs. Since October, he has gained 34 lbs. He has significant abdominal girth and LLE. Repeat echo showed EF 65-70% with mild MR, mild Aortic stenosis, elevated rt side pressure at 35 mm. He states that he is using CPAP on a regular basis with a recent new machine followed by Dr. Maxwell Caul.  He denies any alcohol, tobacco, excessive caffeine. He tries to go to gym and water walk as he has knee issues, prior replacement that limit walking.  F/u in afib clinic, 8/1. He has lost 9 lbs with doubling dose of  lasix. His monitor malfunctioned last week and is pending a replacement.He has not appreciated any irregular heart beat.   Today, he denies symptoms of palpitations, chest pain,  orthopnea, PND, dizziness, presyncope, syncope, or neurologic sequela. Positive for weight gain, LLE, shortness of breath. The patient is tolerating medications without difficulties and is otherwise without complaint today.   Past Medical History:  Diagnosis Date  . Depression   . Hyperlipidemia   .  Hypertension   . Primary localized osteoarthritis of left knee   . Primary localized osteoarthritis of right knee   . Shortness of breath dyspnea   . Sleep apnea    CPAP  . Syncope 12/11/2012   Past Surgical History:  Procedure Laterality Date  . EYE SURGERY Left prosthesis  . KNEE SURGERY Bilateral   . 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 ROTATOR CUFF REPAIR    . TOTAL KNEE ARTHROPLASTY Right 09/17/2014   Procedure: RIGHT TOTAL KNEE ARTHROPLASTY Steroid injection LEFT KNEE;  Surgeon: [name], MD;  Location: [place];  Service: Orthopedics;  Laterality: Right;  . TOTAL KNEE ARTHROPLASTY Left 03/25/2015   Procedure: TOTAL KNEE ARTHROPLASTY;  Surgeon: [name], MD;  Location: [facility];  Service: Orthopedics;  Laterality: Left;    Current Outpatient Prescriptions  Medication Sig Dispense Refill  . citalopram (CELEXA) 20 MG tablet Take 20 mg by mouth daily.    Marland Kitchen docusate sodium 100 MG CAPS Take 100 mg by mouth 2 (two) times daily. 60 capsule 0  . furosemide (LASIX) 40 MG tablet Take 1 tablet (40 mg total) by mouth 2 (two) times daily. 90 tablet 1  . gabapentin (NEURONTIN) 300 MG capsule Take 300 mg by mouth 3 (three) times daily.    . IRON PO Take 1 tablet by mouth daily.    Marland Kitchen lisinopril (PRINIVIL,ZESTRIL) 20 MG tablet Take 1 tablet (  20 mg total) by mouth daily. 90 tablet 3  . loratadine (CLARITIN) 10 MG tablet Take 10 mg by mouth daily as needed for allergies.    . Multiple Vitamin (MV-ONE) CAPS Take 1 capsule by mouth daily.    . Omega-3 Fatty Acids (FISH OIL PO) Take 1 tablet by mouth daily.    . rivaroxaban (XARELTO) 20 MG TABS tablet Take 1 tablet (20 mg total) by mouth daily with supper. 30 tablet 0  . simvastatin (ZOCOR) 20 MG tablet Take 20 mg by mouth daily.    . Tamsulosin HCl (FLOMAX) 0.4 MG CAPS Take 0.4 mg by mouth daily.    . traZODone (DESYREL) 50 MG tablet Take 50 mg by mouth at bedtime as needed. sleep  3  . verapamil (CALAN-SR) 120 MG CR tablet Take 120  mg by mouth 3 (three) times daily.      No current facility-administered medications for this encounter.     Allergies  Allergen Reactions  . Demerol [Meperidine] Nausea And Vomiting and Rash  . Meloxicam     Increases blood pressure  . Keflex [Cephalexin] Rash  . Penicillins Rash    Social History   Social History  . Marital status: Married    Spouse name: N/A  . Number of children: N/A  . Years of education: N/A   Occupational History  . Not on file.   Social History Main Topics  . Smoking status: Former Smoker    Quit date: 10/19/1996  . Smokeless tobacco: Never Used  . Alcohol use Yes     Comment: occassional  . Drug use: No  . Sexual activity: Not on file   Other Topics Concern  . Not on file   Social History Narrative  . No narrative on file    Family History  Problem Relation Age of Onset  . Diabetes Maternal Grandmother   . Heart attack Father   . Hypertension Father   . Heart disease Mother   . Hypertension Mother   . Heart attack Brother     ROS- All systems are reviewed and negative except as per the HPI above  Physical Exam: Vitals:   05/19/17 1024  BP: 132/68  Pulse: 77  Weight: 280 lb 3.2 oz (127.1 kg)  Height: 5\' 7"  (1.702 m)   Wt Readings from Last 3 Encounters:  05/19/17 280 lb 3.2 oz (127.1 kg)  05/11/17 289 lb 6.4 oz (131.3 kg)  04/28/17 286 lb 8 oz (130 kg)    Labs: Lab Results  Component Value Date   NA 138 05/19/2017   K 3.5 05/19/2017   CL 103 05/19/2017   CO2 28 05/19/2017   GLUCOSE 123 (H) 05/19/2017   BUN 19 05/19/2017   CREATININE 0.95 05/19/2017   CALCIUM 9.0 05/19/2017   Lab Results  Component Value Date   INR 1.16 03/15/2015   No results found for: CHOL, HDL, LDLCALC, TRIG   BNP -452 TSH- 0.763  GEN- The patient is morbidly obese, well appearing, alert and oriented x 3 today.   Head- normocephalic, atraumatic Eyes-  Sclera clear, conjunctiva pink Ears- hearing intact Oropharynx- clear Neck-  supple, no JVP Lymph- no cervical lymphadenopathy Lungs- Clear to ausculation bilaterally, normal work of breathing Heart- Regular rate and rhythm, no murmurs, rubs or gallops, PMI not laterally displaced GI- soft, NT, ND, + BS Extremities- no clubbing, cyanosis, LE very taunt and shiny, hard to pit MS- no significant deformity or atrophy Skin- no rash or lesion Psych- euthymic  mood, full affect Neuro- strength and sensation are intact  EKG-NSR with IRBBB, pr int 154 ms, qrs int 116 ms, qtc 456 ms Rhythm strips reviewed ,afib with controlled rate Echo-Study Conclusions  - Left ventricle: The cavity size was at the upper limits of   normal. Systolic function was vigorous. The estimated ejection   fraction was in the range of 65% to 70%. Wall motion was normal;   there were no regional wall motion abnormalities. Left   ventricular diastolic function parameters were normal. - Aortic valve: Transvalvular velocity was increased more than   expected, due to high cardiac output. There was very mild   stenosis. Valve area (VTI): 2.04 cm^2. Valve area (Vmax): 1.8   cm^2. - Mitral valve: There was mild regurgitation. Valve area by   pressure half-time: 1.73 cm^2. Valve area by continuity equation   (using LVOT flow): 2.4 cm^2. - Left atrium: The atrium was mildly dilated. - Right ventricle: The cavity size was mildly dilated. Wall   thickness was normal. - Right atrium: The atrium was mildly dilated. - Pulmonary arteries: Systolic pressure was mildly increased. PA   peak pressure: 35 mm Hg (S).    Assessment and Plan: 1. New onset afb General education re afib The strips I reviewed are rate controlled and will continue pt on verapamil 120 mg tid without change. Currently off monitor due to malfunction but replacement is pending. Chadsvasc score of 2(htn,age, but will be 3 with age 8 in November) Continue  xarelto 20 mg daily, no issues with starting drug Denies bleeding  risk Bleeding precautions discussed Advised not to give blood on blood thinner Off asa Do not take NSAIDS   2. Weight gain/LEE Has gained 30+ lbs since last fall Not sure how much is fluid/true weight gain, but he obviously has very significant abdominal girth and LLE Has lost 9 lbs since increase of lasix to 40 mg bid Avoid salt Reduce caloric intake bmet today K+ is 3.5. On lasix 40 mg bid, will start k+ 20 meq a day  Pending f/u with Cecilie Kicks, NP 8/23  afib clinic as needed  Geroge Baseman. Keeana Pieratt, [facility] [address] [place],  [address] [phone]

## 2017-05-24 ENCOUNTER — Encounter: Payer: Self-pay | Admitting: Cardiology

## 2017-05-24 NOTE — Addendum Note (Signed)
Encounter addended by: Sherran Needs, NP on: 05/24/2017  8:45 AM<BR>    Actions taken: LOS modified

## 2017-06-06 ENCOUNTER — Other Ambulatory Visit (HOSPITAL_COMMUNITY): Payer: Self-pay | Admitting: Nurse Practitioner

## 2017-06-09 NOTE — Progress Notes (Signed)
Cardiology Office Note   Date:  06/10/2017   ID:  Joe [name], DOB 1941-06-24, MRN [id number]  PCP:  [name], MD  Cardiologist:  Dr. Johnsie Cancel    Chief Complaint  Patient presents with  . Shortness of Breath    Seen for Dr. Johnsie Cancel      History of Present Illness: Joe [name] is a 76 y.o. male who presents for HF and edema.    He has a h/o HTN, obesity,OSA treated with cpap, RBBB, remote syncopal episode, with new onset afib found on event monitor that was just placed, in the afib clinic for f/u for this finding. Pt has not felt well for several months and had noticed increased swelling in his lower extremities and weight gain. He gives blood on a regular basis and on 2 times in June, he was not allowed to give due to irregular HB found on work up. He was able to give this past Sunday. He gives blood around 2x a month.  He saw [name], [place] with c/o shortness of breath and LLE, reports of irregular HB. She placed an event monitor, repeated Echo, drew a BNP, Bmet and TSH, stopped HCTZ and started lasix 40 mg a day. Repeat Bmet on lasix ok, however pt has not lost any weight on lasix, but has gained 3 more lbs. Since October, he has gained 34 lbs. He has significant abdominal girth and LLE. Repeat echo showed EF 65-70% with mild MR, mild Aortic stenosis, elevated rt side pressure at 35 mm. He states that he is using CPAP on a regular basis with a recent new machine followed by Dr. Maxwell Caul.  Today with increase of lasix his wt is down but he still is volume overloaded.  Still with lower ext edema, and increased abd girth.  At home wt is 269 but his dry home wt was 255lbs.  Here he is 289 with clothes shoes and event monitor.   Today is last day of event monitor.  He does no kinow when he has a fib.   He needs refill on xarelto.  No chest pain.  No SOB, sleeps well at night.   He exercises with water walking and plans to start back once monitor is off.  He is watching his salt intake.        Past Medical History:  Diagnosis Date  . Depression   . Hyperlipidemia   . Hypertension   . Primary localized osteoarthritis of left knee   . Primary localized osteoarthritis of right knee   . Shortness of breath dyspnea   . Sleep apnea    CPAP  . Syncope 12/11/2012    Past Surgical History:  Procedure Laterality Date  . EYE SURGERY Left prosthesis  . KNEE SURGERY Bilateral   . 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 ROTATOR CUFF REPAIR    . TOTAL KNEE ARTHROPLASTY Right 09/17/2014   Procedure: RIGHT TOTAL KNEE ARTHROPLASTY Steroid injection LEFT KNEE;  Surgeon: [name], MD;  Location: [facility];  Service: Orthopedics;  Laterality: Right;  . TOTAL KNEE ARTHROPLASTY Left 03/25/2015   Procedure: TOTAL KNEE ARTHROPLASTY;  Surgeon: [name], MD;  Location: [facility];  Service: Orthopedics;  Laterality: Left;     Current Outpatient Prescriptions  Medication Sig Dispense Refill  . citalopram (CELEXA) 20 MG tablet Take 20 mg by mouth daily.    . furosemide (LASIX) 40 MG tablet Take 1 tablet (40 mg  total) by mouth 2 (two) times daily. 90 tablet 1  . gabapentin (NEURONTIN) 300 MG capsule Take 300 mg by mouth 3 (three) times daily.    . IRON PO Take 1 tablet by mouth daily.    Marland Kitchen lisinopril (PRINIVIL,ZESTRIL) 20 MG tablet Take 1 tablet (20 mg total) by mouth daily. 90 tablet 3  . loratadine (CLARITIN) 10 MG tablet Take 10 mg by mouth daily as needed for allergies.    . Multiple Vitamin (MV-ONE) CAPS Take 1 capsule by mouth daily.    . Omega-3 Fatty Acids (FISH OIL PO) Take 1 tablet by mouth daily.    . potassium chloride SA (K-DUR,KLOR-CON) 20 MEQ tablet Take 1 tablet (20 mEq total) by mouth daily. 30 tablet 3  . rivaroxaban (XARELTO) 20 MG TABS tablet Take 1 tablet (20 mg total) by mouth daily with supper. 90 tablet 3  . simvastatin (ZOCOR) 20 MG tablet Take 20 mg by mouth daily.    . Tamsulosin HCl (FLOMAX) 0.4 MG CAPS Take 0.4 mg by mouth daily.    . traMADol  (ULTRAM) 50 MG tablet Take 50 mg by mouth daily as needed.    . traZODone (DESYREL) 50 MG tablet Take 50 mg by mouth at bedtime as needed. sleep  3  . verapamil (CALAN-SR) 120 MG CR tablet Take 120 mg by mouth 3 (three) times daily.     . metolazone (ZAROXOLYN) 2.5 MG tablet Take 1 tablet (2.5 mg) Friday, Monday and Wednesday. 10 tablet 0   No current facility-administered medications for this visit.     Allergies:   Demerol [meperidine]; Meloxicam; Keflex [cephalexin]; and Penicillins    Social History:  The patient  reports that he quit smoking about 20 years ago. He has never used smokeless tobacco. He reports that he drinks alcohol. He reports that he does not use drugs.   Family History:  The patient's family history includes Diabetes in his maternal grandmother; Heart attack in his brother and father; Heart disease in his mother; Hypertension in his father and mother.    ROS:  General:no colds or fevers, gradual decrease in weight  Skin:no rashes or ulcers HEENT:no blurred vision, no congestion CV:see HPI PUL:see HPI--uses CPAP at night nasal mask GI:no diarrhea constipation or melena, no indigestion GU:no hematuria, no dysuria MS:no joint pain, no claudication Neuro:no syncope, no lightheadedness Endo:no diabetes, no thyroid disease  Wt Readings from Last 3 Encounters:  06/10/17 281 lb 6.4 oz (127.6 kg)  05/19/17 280 lb 3.2 oz (127.1 kg)  05/11/17 289 lb 6.4 oz (131.3 kg)     PHYSICAL EXAM: VS:  BP 124/66   Pulse 75   Ht 5\' 7"  (1.702 m)   Wt 281 lb 6.4 oz (127.6 kg)   SpO2 93%   BMI 44.07 kg/m  , BMI Body mass index is 44.07 kg/m. General:Pleasant affect, NAD Skin:Warm and dry, brisk capillary refill HEENT:normocephalic, sclera clear, mucus membranes moist Neck:supple, + mild JVD, no bruits  Heart:S1S2 RRR without murmur, gallup, rub or click Lungs:clear without rales, rhonchi, or wheezes PJK:DTOI, non tender, + BS, do not palpate liver spleen or  masses Ext:1-2+lower ext edema, 2+ pedal pulses, 2+ radial pulses Neuro:alert and oriented X 3, MAE, follows commands, + facial symmetry    EKG:  EKG is NOT ordered today.    Recent Labs: 04/28/2017: NT-Pro BNP 452; TSH 0.763 05/11/2017: Hemoglobin 13.1; Platelets 131 05/19/2017: BUN 19; Creatinine, Ser 0.95; Potassium 3.5; Sodium 138    Lipid Panel No results  found for: CHOL, TRIG, HDL, CHOLHDL, VLDL, LDLCALC, LDLDIRECT     Other studies Reviewed: Additional studies/ records that were reviewed today include: . ECHO 05/10/17  Study Conclusions  - Left ventricle: The cavity size was at the upper limits of   normal. Systolic function was vigorous. The estimated ejection   fraction was in the range of 65% to 70%. Wall motion was normal;   there were no regional wall motion abnormalities. Left   ventricular diastolic function parameters were normal. - Aortic valve: Transvalvular velocity was increased more than   expected, due to high cardiac output. There was very mild   stenosis. Valve area (VTI): 2.04 cm^2. Valve area (Vmax): 1.8   cm^2. - Mitral valve: There was mild regurgitation. Valve area by   pressure half-time: 1.73 cm^2. Valve area by continuity equation   (using LVOT flow): 2.4 cm^2. - Left atrium: The atrium was mildly dilated. - Right ventricle: The cavity size was mildly dilated. Wall   thickness was normal. - Right atrium: The atrium was mildly dilated. - Pulmonary arteries: Systolic pressure was mildly increased. PA   peak pressure: 35 mm Hg (S).  GXT 09/09/15 Study Highlights  Blood pressure demonstrated a hypertensive response to exercise.  There was no ST segment deviation noted during stress.      ASSESSMENT AND PLAN:  1.  Acute on chronic diastolic HF and in combination with a fib.  Edema improving slowly on lasix 40 BID, will add metolazone 2.5 mg on MWF for 1 week.  Will see him back in 2 weeks and Dr. Johnsie Cancel will see in 2 months. Decrease  salt.  Will check BMP today with recent hypokalemia   2.  Persistent a fib maintaining SR - still with event monitor.   3.  Anticoagulation with Xarelto refilled today.  No bleeding.  4.   Mild AS   Will follow  5.  RBBB chronic  6. OSA followed by PCP   Current medicines are reviewed with the patient today.  The patient Has no concerns regarding medicines.  The following changes have been made:  See above Labs/ tests ordered today include:see above  Disposition:   FU:  see above  Signed, Cecilie Kicks, NP  06/10/2017 10:43 AM    [facility], [place], [place], [place] Phone: [phone]; Fax: [phone]

## 2017-06-10 ENCOUNTER — Ambulatory Visit (INDEPENDENT_AMBULATORY_CARE_PROVIDER_SITE_OTHER): Payer: Medicare Other | Admitting: Cardiology

## 2017-06-10 ENCOUNTER — Encounter: Payer: Self-pay | Admitting: Cardiology

## 2017-06-10 VITALS — BP 124/66 | HR 75 | Ht 67.0 in | Wt 281.4 lb

## 2017-06-10 DIAGNOSIS — G4733 Obstructive sleep apnea (adult) (pediatric): Secondary | ICD-10-CM

## 2017-06-10 DIAGNOSIS — I350 Nonrheumatic aortic (valve) stenosis: Secondary | ICD-10-CM

## 2017-06-10 DIAGNOSIS — R0600 Dyspnea, unspecified: Secondary | ICD-10-CM

## 2017-06-10 DIAGNOSIS — E876 Hypokalemia: Secondary | ICD-10-CM | POA: Diagnosis not present

## 2017-06-10 DIAGNOSIS — I481 Persistent atrial fibrillation: Secondary | ICD-10-CM

## 2017-06-10 DIAGNOSIS — I4510 Unspecified right bundle-branch block: Secondary | ICD-10-CM | POA: Diagnosis not present

## 2017-06-10 DIAGNOSIS — Z9989 Dependence on other enabling machines and devices: Secondary | ICD-10-CM

## 2017-06-10 DIAGNOSIS — I10 Essential (primary) hypertension: Secondary | ICD-10-CM | POA: Diagnosis not present

## 2017-06-10 DIAGNOSIS — I4819 Other persistent atrial fibrillation: Secondary | ICD-10-CM

## 2017-06-10 LAB — BASIC METABOLIC PANEL
BUN / CREAT RATIO: 21 (ref 10–24)
BUN: 19 mg/dL (ref 8–27)
CO2: 25 mmol/L (ref 20–29)
CREATININE: 0.92 mg/dL (ref 0.76–1.27)
Calcium: 9.1 mg/dL (ref 8.6–10.2)
Chloride: 101 mmol/L (ref 96–106)
GFR calc Af Amer: 94 mL/min/{1.73_m2} (ref >59)
GFR calc non Af Amer: 82 mL/min/{1.73_m2} (ref >59)
GLUCOSE: 112 mg/dL — AB (ref 65–99)
Potassium: 4 mmol/L (ref 3.5–5.2)
SODIUM: 141 mmol/L (ref 134–144)

## 2017-06-10 MED ORDER — METOLAZONE 2.5 MG PO TABS: ORAL_TABLET | ORAL | 0 refills | Status: DC

## 2017-06-10 MED ORDER — RIVAROXABAN 20 MG PO TABS: 20.0000 mg | ORAL_TABLET | Freq: Every day | ORAL | 3 refills | Status: DC

## 2017-06-10 NOTE — Patient Instructions (Addendum)
Medication Instructions:  1) TAKE METOLAZONE 2.5 mg tomorrow (Friday), Monday and Wednesday.   Labwork: TODAY: BMET  Testing/Procedures: None  Follow-Up: You have a follow up appointment scheduled with Cecilie Kicks on 07/05/17 at 11:00AM.  Dr. Kyla Balzarine nurse will call you to schedule an appointment with him in October or November.  Any Other Special Instructions Will Be Listed Below (If Applicable).     If you need a refill on your cardiac medications before your next appointment, please call your pharmacy.

## 2017-06-11 ENCOUNTER — Telehealth: Payer: Self-pay | Admitting: Cardiology

## 2017-06-11 NOTE — Telephone Encounter (Signed)
Patient returning your call for results

## 2017-06-11 NOTE — Telephone Encounter (Signed)
Returned pts call and he has been made aware of his lab results. See result note. 

## 2017-06-11 NOTE — Telephone Encounter (Signed)
-----   Message from Isaiah Serge, NP sent at 06/10/2017  4:36 PM EDT ----- K+ is stable.  Also kidney function is normal. .

## 2017-06-17 ENCOUNTER — Other Ambulatory Visit: Payer: Self-pay | Admitting: Cardiology

## 2017-06-17 MED ORDER — FUROSEMIDE 40 MG PO TABS: 40.0000 mg | ORAL_TABLET | Freq: Two times a day (BID) | ORAL | 3 refills | Status: DC

## 2017-07-04 NOTE — Progress Notes (Signed)
Cardiology Office Note   Date:  07/05/2017   ID:  Joe [name], DOB 04/13/1941, MRN [id number]  PCP:  [name], MD  Cardiologist:  Dr. Johnsie Cancel    Chief Complaint  Patient presents with  . Edema      History of Present Illness: Joe [name] is a 76 y.o. male who presents for follow up for edema.   He has a h/o HTN, obesity,OSA treated with cpap, RBBB, remote syncopal episode, with new onset afib found on event monitor that was just placed, in the afib clinic for f/u for this finding. Pt has not felt well for several months and had noticed increased swelling in his lower extremities and weight gain. He gives blood on a regular basis and on 2 times in June, he was not allowed to give due to irregular HB found on work up. He was able to give this past Sunday. He gives blood around 2x a month.  He saw [name], [place] with c/o shortness of breath and LLE, reports of irregular HB. She placed an event monitor, repeated Echo, drew a BNP, Bmet and TSH, stopped HCTZ and started lasix 40 mg a day. Repeat Bmet on lasix ok, however pt has not lost any weight on lasix, but has gained 3 more lbs. Since October, he has gained 34 lbs. He has significant abdominal girth and LLE. Repeat echo showed EF 65-70% with mild MR, mild Aortic stenosis, elevated rt side pressure at 35 mm. He states that he is using CPAP on a regular basis with a recent new machine followed by Dr. Maxwell Caul.  Last visit I added metolazone 2.5 mg MWF, for 1 week for continued edema and increased abd girth. Dry wt is 255 lbs. Short run of a fib otherwise SR.  Today his wt is down 5 lbs and he feels better but still with abd girth.  His  Home wt is 169 still up from dry wt.  The metolazone did cause him to void freq.  He has no chest pain and no SOB.    Overall he is better but still with the girth and with talking some SOB.  Labs were reviewed and stable at last visit.   Discussed decreasing salt.  Past Medical History:  Diagnosis  Date  . Depression   . Hyperlipidemia   . Hypertension   . Primary localized osteoarthritis of left knee   . Primary localized osteoarthritis of right knee   . Shortness of breath dyspnea   . Sleep apnea    CPAP  . Syncope 12/11/2012    Past Surgical History:  Procedure Laterality Date  . EYE SURGERY Left prosthesis  . KNEE SURGERY Bilateral   . 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 ROTATOR CUFF REPAIR    . TOTAL KNEE ARTHROPLASTY Right 09/17/2014   Procedure: RIGHT TOTAL KNEE ARTHROPLASTY Steroid injection LEFT KNEE;  Surgeon: [name], MD;  Location: [facility];  Service: Orthopedics;  Laterality: Right;  . TOTAL KNEE ARTHROPLASTY Left 03/25/2015   Procedure: TOTAL KNEE ARTHROPLASTY;  Surgeon: [name], MD;  Location: [facility];  Service: Orthopedics;  Laterality: Left;     Current Outpatient Prescriptions  Medication Sig Dispense Refill  . citalopram (CELEXA) 20 MG tablet Take 20 mg by mouth daily.    . furosemide (LASIX) 40 MG tablet Take 1 tablet (40 mg total) by mouth 2 (two) times daily. 180 tablet 3  . gabapentin (NEURONTIN) 300  MG capsule Take 300 mg by mouth 3 (three) times daily.    . IRON PO Take 1 tablet by mouth daily.    Marland Kitchen lisinopril (PRINIVIL,ZESTRIL) 20 MG tablet Take 1 tablet (20 mg total) by mouth daily. 90 tablet 3  . loratadine (CLARITIN) 10 MG tablet Take 10 mg by mouth daily as needed for allergies.    . Multiple Vitamin (MV-ONE) CAPS Take 1 capsule by mouth daily.    . Omega-3 Fatty Acids (FISH OIL PO) Take 1 tablet by mouth daily.    . potassium chloride SA (K-DUR,KLOR-CON) 20 MEQ tablet Take 1 tablet (20 mEq total) by mouth daily. 30 tablet 3  . rivaroxaban (XARELTO) 20 MG TABS tablet Take 1 tablet (20 mg total) by mouth daily with supper. 90 tablet 3  . simvastatin (ZOCOR) 20 MG tablet Take 20 mg by mouth daily.    . Tamsulosin HCl (FLOMAX) 0.4 MG CAPS Take 0.4 mg by mouth daily.    . traMADol (ULTRAM) 50 MG tablet Take 50 mg by mouth daily  as needed.    . traZODone (DESYREL) 50 MG tablet Take 50 mg by mouth at bedtime as needed. sleep  3  . verapamil (CALAN-SR) 120 MG CR tablet Take 120 mg by mouth 3 (three) times daily.      No current facility-administered medications for this visit.     Allergies:   Demerol [meperidine]; Meloxicam; Keflex [cephalexin]; and Penicillins    Social History:  The patient  reports that he quit smoking about 20 years ago. He has never used smokeless tobacco. He reports that he drinks alcohol. He reports that he does not use drugs.   Family History:  The patient's family history includes Diabetes in his maternal grandmother; Heart attack in his brother and father; Heart disease in his mother; Hypertension in his father and mother.    ROS:  General:no colds or fevers, no weight changes Skin:no rashes or ulcers HEENT:no blurred vision, no congestion CV:see HPI PUL:see HPI GI:no diarrhea constipation or melena, no indigestion GU:no hematuria, no dysuria MS:no joint pain, no claudication Neuro:no syncope, no lightheadedness Endo:no diabetes, no thyroid disease  Wt Readings from Last 3 Encounters:  07/05/17 275 lb 12.8 oz (125.1 kg)  06/10/17 281 lb 6.4 oz (127.6 kg)  05/19/17 280 lb 3.2 oz (127.1 kg)     PHYSICAL EXAM: VS:  BP (!) 142/64   Pulse 64   Ht 5\' 7"  (1.702 m)   Wt 275 lb 12.8 oz (125.1 kg)   BMI 43.20 kg/m  , BMI Body mass index is 43.2 kg/m. General:Pleasant affect, NAD Skin:Warm and dry, brisk capillary refill HEENT:normocephalic, sclera clear, mucus membranes moist Neck:supple, no JVD, no bruits  Heart:S1S2 RRR without murmur, gallup, rub or click Lungs:clear without rales, rhonchi, or wheezes XTK:WIOXB, soft, non tender, + BS, do not palpate liver spleen or masses Ext:tr to 1+ lower ext edema, 2+ pedal pulses, 2+ radial pulses Neuro:alert and oriented X 3, MAE, follows commands, + facial symmetry    EKG:  EKG is NOT ordered today.   Recent Labs: 04/28/2017:  NT-Pro BNP 452; TSH 0.763 05/11/2017: Hemoglobin 13.1; Platelets 131 06/10/2017: BUN 19; Creatinine, Ser 0.92; Potassium 4.0; Sodium 141    Lipid Panel No results found for: CHOL, TRIG, HDL, CHOLHDL, VLDL, LDLCALC, LDLDIRECT     Other studies Reviewed: Additional studies/ records that were reviewed today include: . ECHO 05/10/17  Study Conclusions  - Left ventricle: The cavity size was at the upper limits  of normal. Systolic function was vigorous. The estimated ejection fraction was in the range of 65% to 70%. Wall motion was normal; there were no regional wall motion abnormalities. Left ventricular diastolic function parameters were normal. - Aortic valve: Transvalvular velocity was increased more than expected, due to high cardiac output. There was very mild stenosis. Valve area (VTI): 2.04 cm^2. Valve area (Vmax): 1.8 cm^2. - Mitral valve: There was mild regurgitation. Valve area by pressure half-time: 1.73 cm^2. Valve area by continuity equation (using LVOT flow): 2.4 cm^2. - Left atrium: The atrium was mildly dilated. - Right ventricle: The cavity size was mildly dilated. Wall thickness was normal. - Right atrium: The atrium was mildly dilated. - Pulmonary arteries: Systolic pressure was mildly increased. PA peak pressure: 35 mm Hg (S).  GXT 09/09/15 Study Highlights  Blood pressure demonstrated a hypertensive response to exercise.  There was no ST segment deviation noted during stress.      ASSESSMENT AND PLAN:  1.  Acute on chronic diastolic HF and in combination with a fib.  Edema improving and increased with lasix 40 BID, and metolazone 2.5 mg on MWF for 1 week.  Will continue lasix and have him take metolazone every Wed.  Follow up with Dr. Johnsie Cancel will see in 2 months. Decrease salt.  Will check BMP today   2.  Persistent a fib maintaining SR - event monitor with brief episode of a fib. But otherwise SR.     Current medicines  are reviewed with the patient today.  The patient Has no concerns regarding medicines.  The following changes have been made:  See above Labs/ tests ordered today include:see above  Disposition:   FU:  see above  Signed, Cecilie Kicks, NP  07/05/2017 10:58 AM    [facility], [place], [place], [place] Phone: [phone]; Fax: [phone]

## 2017-07-05 ENCOUNTER — Encounter: Payer: Self-pay | Admitting: Cardiology

## 2017-07-05 ENCOUNTER — Ambulatory Visit (INDEPENDENT_AMBULATORY_CARE_PROVIDER_SITE_OTHER): Payer: Medicare Other | Admitting: Cardiology

## 2017-07-05 VITALS — BP 142/64 | HR 64 | Ht 67.0 in | Wt 275.8 lb

## 2017-07-05 DIAGNOSIS — E876 Hypokalemia: Secondary | ICD-10-CM | POA: Diagnosis not present

## 2017-07-05 DIAGNOSIS — R601 Generalized edema: Secondary | ICD-10-CM | POA: Diagnosis not present

## 2017-07-05 LAB — BASIC METABOLIC PANEL
BUN/Creatinine Ratio: 31 — ABNORMAL HIGH (ref 10–24)
BUN: 31 mg/dL — AB (ref 8–27)
CALCIUM: 9.3 mg/dL (ref 8.6–10.2)
CHLORIDE: 99 mmol/L (ref 96–106)
CO2: 26 mmol/L (ref 20–29)
CREATININE: 1.01 mg/dL (ref 0.76–1.27)
GFR calc Af Amer: 84 mL/min/{1.73_m2} (ref >59)
GFR calc non Af Amer: 73 mL/min/{1.73_m2} (ref >59)
GLUCOSE: 86 mg/dL (ref 65–99)
Potassium: 4.5 mmol/L (ref 3.5–5.2)
Sodium: 140 mmol/L (ref 134–144)

## 2017-07-05 MED ORDER — METOLAZONE 2.5 MG PO TABS: 2.5000 mg | ORAL_TABLET | ORAL | 3 refills | Status: DC

## 2017-07-05 NOTE — Patient Instructions (Addendum)
Medication Instructions: Your physician has recommended you make the following change in your medication:  -1) TAKE Metolazone 2.5 mg by mouth once weekly on Wednesday  Labwork: Your physician recommends that you have for lab work today: BMET  Procedures/Testing: None Ordered  Follow-Up: Your physician wants you to follow-up in October with Dr. Johnsie Cancel   If you need a refill on your cardiac medications before your next appointment, please call your pharmacy.

## 2017-07-06 ENCOUNTER — Telehealth: Payer: Self-pay | Admitting: *Deleted

## 2017-07-06 DIAGNOSIS — I10 Essential (primary) hypertension: Secondary | ICD-10-CM

## 2017-07-06 NOTE — Telephone Encounter (Signed)
Spoke with pt and went over lab results and recommendations per Cecilie Kicks, NP.  Pt verbalized understanding and was in agreement with this plan.  Labs scheduled for 07/23/17.  Pt appreciative for call.

## 2017-07-06 NOTE — Telephone Encounter (Signed)
-----   Message from Isaiah Serge, NP sent at 07/05/2017  9:14 PM EDT ----- Stable labs but recheck BMP in 2weeks

## 2017-07-07 ENCOUNTER — Other Ambulatory Visit: Payer: Self-pay | Admitting: Cardiology

## 2017-07-23 ENCOUNTER — Other Ambulatory Visit: Payer: Medicare Other

## 2017-07-23 DIAGNOSIS — I10 Essential (primary) hypertension: Secondary | ICD-10-CM

## 2017-07-23 LAB — BASIC METABOLIC PANEL
BUN/Creatinine Ratio: 19 (ref 10–24)
BUN: 22 mg/dL (ref 8–27)
CALCIUM: 8.8 mg/dL (ref 8.6–10.2)
CHLORIDE: 98 mmol/L (ref 96–106)
CO2: 26 mmol/L (ref 20–29)
Creatinine, Ser: 1.16 mg/dL (ref 0.76–1.27)
GFR calc non Af Amer: 62 mL/min/{1.73_m2} (ref >59)
GFR, EST AFRICAN AMERICAN: 71 mL/min/{1.73_m2} (ref >59)
GLUCOSE: 107 mg/dL — AB (ref 65–99)
POTASSIUM: 3.4 mmol/L — AB (ref 3.5–5.2)
Sodium: 140 mmol/L (ref 134–144)

## 2017-07-26 ENCOUNTER — Other Ambulatory Visit: Payer: Self-pay

## 2017-07-26 DIAGNOSIS — E876 Hypokalemia: Secondary | ICD-10-CM

## 2017-08-02 ENCOUNTER — Other Ambulatory Visit: Payer: Medicare Other

## 2017-08-02 ENCOUNTER — Ambulatory Visit: Payer: Medicare Other | Admitting: Cardiovascular Disease

## 2017-08-02 DIAGNOSIS — E876 Hypokalemia: Secondary | ICD-10-CM

## 2017-08-03 LAB — BASIC METABOLIC PANEL
BUN / CREAT RATIO: 20 (ref 10–24)
BUN: 18 mg/dL (ref 8–27)
CO2: 24 mmol/L (ref 20–29)
CREATININE: 0.92 mg/dL (ref 0.76–1.27)
Calcium: 9.4 mg/dL (ref 8.6–10.2)
Chloride: 102 mmol/L (ref 96–106)
GFR calc Af Amer: 94 mL/min/{1.73_m2} (ref >59)
GFR, EST NON AFRICAN AMERICAN: 82 mL/min/{1.73_m2} (ref >59)
GLUCOSE: 95 mg/dL (ref 65–99)
Potassium: 4.4 mmol/L (ref 3.5–5.2)
SODIUM: 143 mmol/L (ref 134–144)

## 2017-08-06 ENCOUNTER — Telehealth: Payer: Self-pay

## 2017-08-06 NOTE — Telephone Encounter (Signed)
Left voicemail on identified voicemail (DPR)  letting patient know that per Cecilie Kicks, NP Labs improved. Continued current meds. Let patient know if he had any questions he could call office and left number.

## 2017-08-06 NOTE — Telephone Encounter (Signed)
-----   Message from Isaiah Serge, NP sent at 08/04/2017  4:34 PM EDT ----- Labs improved.  Continue current meds.

## 2017-08-11 ENCOUNTER — Telehealth: Payer: Self-pay | Admitting: Cardiology

## 2017-08-11 NOTE — Telephone Encounter (Signed)
Spoke with pt and he received message about lab results.  Pt states that when he last saw Cecilie Kicks, NP, he was told to increase K+ to 2 tablets QD.  Current prescription is for 1 tablet  QD.  Pt wants to know if he should continue 2 tablets QD, and if so, he needs prescription sent into [facility].  Advised I would send message to Cecilie Kicks, NP for review and advisement.

## 2017-08-11 NOTE — Telephone Encounter (Signed)
New Message ° ° pt verbalized that he is returning call for rn  °

## 2017-08-12 MED ORDER — POTASSIUM CHLORIDE CRYS ER 20 MEQ PO TBCR: 20.0000 meq | EXTENDED_RELEASE_TABLET | Freq: Every day | ORAL | 1 refills | Status: DC

## 2017-08-12 NOTE — Telephone Encounter (Signed)
Spoke with patient regarding his potassium prescription. He is able to take (swallow) 1x 20 meq tablet daily without any problem.  We will order that with his pharmacy.

## 2017-08-12 NOTE — Telephone Encounter (Signed)
Yes 20 meq per day - either 2 --10 meq or one 20, the 10 meq ore smaller

## 2017-08-17 NOTE — Progress Notes (Signed)
Cardiology Office Note   Date:  08/18/2017   ID:  MABEL UNREIN, DOB 02/28/1941, MRN [id number]  PCP:  [name], MD  Cardiologist:  Dr. Johnsie Cancel    No chief complaint on file.     History of Present Illness: Joe [name] is a 76 y.o. male who presents for follow up for edema.   He has a h/o HTN, obesity,OSA treated with cpap, RBBB, remote syncopal episode, with new onset afib found on event monitor  Pt has not felt well for several months and had noticed increased swelling in his lower extremities and weight gain.    He saw K. [place], [place] 04/28/17 with c/o shortness of breath and LLE, reports of irregular HB. She placed an event monitor, repeated Echo, drew a BNP, Bmet and TSH, stopped HCTZ and started lasix 40 mg a day. Repeat Bmet on lasix ok, however pt has not lost any weight on lasix, but has gained 3 more lbs. Since October, he has gained 34 lbs. He has significant abdominal girth and LLE. Repeat echo showed EF 65-70% with mild MR, mild Aortic stenosis, elevated rt side pressure at 35 mm. He states that he is using CPAP on a regular basis with a recent new machine followed by Dr. Maxwell Caul.  07/05/17 PA  I added metolazone 2.5 mg MWF, for 1 week for continued edema and increased abd girth.    His abdominal girth is just obesity Edema better Trying to do water aerobics. 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 Depression   . Hyperlipidemia   . Hypertension   . Primary localized osteoarthritis of left knee   . Primary localized osteoarthritis of right knee   . Shortness of breath dyspnea   . Sleep apnea    CPAP  . Syncope 12/11/2012    Past Surgical History:  Procedure Laterality Date  . EYE SURGERY Left prosthesis  . KNEE SURGERY Bilateral   . 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 ROTATOR CUFF REPAIR    . TOTAL KNEE ARTHROPLASTY Right 09/17/2014   Procedure: RIGHT TOTAL KNEE ARTHROPLASTY Steroid injection LEFT  KNEE;  Surgeon: [name], MD;  Location: [facility];  Service: Orthopedics;  Laterality: Right;  . TOTAL KNEE ARTHROPLASTY Left 03/25/2015   Procedure: TOTAL KNEE ARTHROPLASTY;  Surgeon: [name], MD;  Location: [facility];  Service: Orthopedics;  Laterality: Left;     Current Outpatient Prescriptions  Medication Sig Dispense Refill  . citalopram (CELEXA) 20 MG tablet Take 20 mg by mouth daily.    . furosemide (LASIX) 40 MG tablet Take 1 tablet (40 mg total) by mouth 2 (two) times daily. 180 tablet 3  . gabapentin (NEURONTIN) 300 MG capsule Take 300 mg by mouth 3 (three) times daily.    . IRON PO Take 1 tablet by mouth daily.    Marland Kitchen loratadine (CLARITIN) 10 MG tablet Take 10 mg by mouth daily as needed for allergies.    . metolazone (ZAROXOLYN) 2.5 MG tablet Take 1 tablet (2.5 mg total) by mouth once a week. Only on Wednesday 5 tablet 3  . Multiple Vitamin (MV-ONE) CAPS Take 1 capsule by mouth daily.    . Omega-3 Fatty Acids (FISH OIL PO) Take 1 tablet by mouth daily.    . potassium chloride SA (K-DUR,KLOR-CON) 20 MEQ tablet Take 1 tablet (20 mEq total) by mouth daily. 90 tablet 1  .  rivaroxaban (XARELTO) 20 MG TABS tablet Take 1 tablet (20 mg total) by mouth daily with supper. 90 tablet 3  . simvastatin (ZOCOR) 20 MG tablet Take 20 mg by mouth daily.    . Tamsulosin HCl (FLOMAX) 0.4 MG CAPS Take 0.4 mg by mouth daily.    . traMADol (ULTRAM) 50 MG tablet Take 50 mg by mouth daily as needed.    . traZODone (DESYREL) 50 MG tablet Take 50 mg by mouth at bedtime as needed. sleep  3  . verapamil (CALAN-SR) 120 MG CR tablet Take 120 mg by mouth 3 (three) times daily.     Marland Kitchen lisinopril (PRINIVIL,ZESTRIL) 20 MG tablet Take 1 tablet (20 mg total) by mouth daily. 90 tablet 3   No current facility-administered medications for this visit.     Allergies:   Demerol [meperidine]; Meloxicam; Keflex [cephalexin]; and Penicillins    Social History:  The patient  reports that he quit smoking about 20 years  ago. He has never used smokeless tobacco. He reports that he drinks alcohol. He reports that he does not use drugs.   Family History:  The patient's family history includes Diabetes in his maternal grandmother; Heart attack in his brother and father; Heart disease in his mother; Hypertension in his father and mother.    ROS:  General:no colds or fevers, no weight changes Skin:no rashes or ulcers HEENT:no blurred vision, no congestion CV:see HPI PUL:see HPI GI:no diarrhea constipation or melena, no indigestion GU:no hematuria, no dysuria MS:no joint pain, no claudication Neuro:no syncope, no lightheadedness Endo:no diabetes, no thyroid disease  Wt Readings from Last 3 Encounters:  08/18/17 277 lb 6.4 oz (125.8 kg)  07/05/17 275 lb 12.8 oz (125.1 kg)  06/10/17 281 lb 6.4 oz (127.6 kg)     PHYSICAL EXAM: VS:  BP (!) 144/64   Pulse 82   Ht 5\' 7"  (1.702 m)   Wt 277 lb 6.4 oz (125.8 kg)   SpO2 96%   BMI 43.45 kg/m  , BMI Body mass index is 43.45 kg/m. Affect Jovial  Morbidly obese white male  HEENT: normal Neck supple with no adenopathy JVP normal no bruits no thyromegaly Lungs clear with no wheezing and good diaphragmatic motion Heart:  S1/S2 mild AS  murmur, no rub, gallop or click PMI normal Abdomen: benighn, BS positve, no tenderness, no AAA no bruit.  No HSM or HJR Distal pulses intact with no bruits Plus one bilateral edema Neuro non-focal Skin warm and dry No muscular weakness     EKG:   05/19/17 SR RBBB rate 81 PVC;s    Recent Labs: 04/28/2017: NT-Pro BNP 452; TSH 0.763 05/11/2017: Hemoglobin 13.1; Platelets 131 08/02/2017: BUN 18; Creatinine, Ser 0.92; Potassium 4.4; Sodium 143    Lipid Panel No results found for: CHOL, TRIG, HDL, CHOLHDL, VLDL, LDLCALC, LDLDIRECT     Other studies Reviewed: Additional studies/ records that were reviewed today include: . ECHO 05/10/17  Study Conclusions  - Left ventricle: The cavity size was at the upper limits  of normal. Systolic function was vigorous. The estimated ejection fraction was in the range of 65% to 70%. Wall motion was normal; there were no regional wall motion abnormalities. Left ventricular diastolic function parameters were normal. - Aortic valve: Transvalvular velocity was increased more than expected, due to high cardiac output. There was very mild stenosis. Valve area (VTI): 2.04 cm^2. Valve area (Vmax): 1.8 cm^2. - Mitral valve: There was mild regurgitation. Valve area by pressure half-time: 1.73 cm^2. Valve area  by continuity equation (using LVOT flow): 2.4 cm^2. - Left atrium: The atrium was mildly dilated. - Right ventricle: The cavity size was mildly dilated. Wall thickness was normal. - Right atrium: The atrium was mildly dilated. - Pulmonary arteries: Systolic pressure was mildly increased. PA peak pressure: 35 mm Hg (S).  GXT 09/09/15 Study Highlights  Blood pressure demonstrated a hypertensive response to exercise.  There was no ST segment deviation noted during stress.      ASSESSMENT AND PLAN:  1.  Acute on chronic diastolic HF and in combination with a fib.  Edema improving and increased diuretics  2.  Persistent a fib maintaining SR - event monitor with brief episode of a fib. But otherwise SR.   3. HTN:  Well controlled.  Continue current medications and low sodium Dash type diet.    4. Murmur: mild AS should not be clinically significant f/u echo in a year   [employer]

## 2017-08-18 ENCOUNTER — Encounter: Payer: Self-pay | Admitting: Cardiovascular Disease

## 2017-08-18 ENCOUNTER — Ambulatory Visit (INDEPENDENT_AMBULATORY_CARE_PROVIDER_SITE_OTHER): Payer: Medicare Other | Admitting: Cardiovascular Disease

## 2017-08-18 VITALS — BP 144/64 | HR 82 | Ht 67.0 in | Wt 277.4 lb

## 2017-08-18 DIAGNOSIS — I350 Nonrheumatic aortic (valve) stenosis: Secondary | ICD-10-CM

## 2017-08-18 NOTE — Patient Instructions (Signed)

## 2017-08-31 ENCOUNTER — Other Ambulatory Visit: Payer: Self-pay | Admitting: Cardiovascular Disease

## 2017-10-04 ENCOUNTER — Other Ambulatory Visit (HOSPITAL_COMMUNITY): Payer: Self-pay | Admitting: *Deleted

## 2017-10-04 MED ORDER — POTASSIUM CHLORIDE CRYS ER 20 MEQ PO TBCR: 20.0000 meq | EXTENDED_RELEASE_TABLET | Freq: Every day | ORAL | 2 refills | Status: DC

## 2017-10-07 ENCOUNTER — Telehealth: Payer: Self-pay | Admitting: *Deleted

## 2017-10-07 NOTE — Telephone Encounter (Addendum)
   [facility] Pre-operative Risk Assessment    Request for surgical clearance:    1. What type of surgery is being performed? Colonoscopy   2. When is this surgery scheduled? 10-21-2017   3. Are there any medications that need to be held prior to surgery and how long? Xarelto   4. Practice name and name of physician performing surgery? [facility]  Dr. Penelope Coop   5. What is your office phone and fax number? Phone: [phone]  Fax: [phone]  6. Anesthesia type (None, local, MAC, general) ? None    Ladonte Verstraete M 10/07/2017, 3:47 PM  _________________________________________________________________   (provider comments below)

## 2017-10-08 NOTE — Telephone Encounter (Signed)
   Primary Cardiologist: Dr. Johnsie Cancel  Chart reviewed as part of pre-operative protocol coverage. Given past medical history and time since last visit, based on ACC/AHA guidelines, Joe [name] would be at acceptable risk for the planned procedure without further cardiovascular testing. Pt without chest pain or shortness of breath.  We asked him to hold his Xarelto 24 hours prior to the procedure and begin that night if GI MD agrees.  I will route this recommendation to the requesting party via Epic fax function and remove from pre-op pool.  Please call with questions.  Cecilie Kicks, NP 10/08/2017, 4:36 PM

## 2017-10-08 NOTE — Telephone Encounter (Signed)
Will send to [facility]

## 2017-10-08 NOTE — Telephone Encounter (Signed)
Pt takes Xarelto for afib with CHADS2VASc score of 3 (age x2, HTN). Renal function is normal. Ok to hold Xarelto for 24 hours prior to procedure.

## 2017-10-15 ENCOUNTER — Telehealth: Payer: Self-pay | Admitting: Cardiovascular Disease

## 2017-10-15 NOTE — Telephone Encounter (Signed)
New Message  Fax: [phone]  Please read fax, surgical clearance, they did not receive it (Urgent)

## 2017-10-15 NOTE — Telephone Encounter (Signed)
Called [facility] and spoke with Anderson Malta, CMA to verify receipt of clearance. I verified the fax number and Anderson Malta states the fax has not been received. I am now sending through the external fax machine; confirmation received.

## 2017-12-12 ENCOUNTER — Other Ambulatory Visit: Payer: Self-pay | Admitting: Physician Assistant

## 2017-12-21 ENCOUNTER — Telehealth: Payer: Self-pay | Admitting: Physician Assistant

## 2017-12-21 MED ORDER — LISINOPRIL 20 MG PO TABS: 20.0000 mg | ORAL_TABLET | Freq: Every day | ORAL | 1 refills | Status: DC

## 2017-12-21 NOTE — Telephone Encounter (Signed)
New message     Needs to verify prescription  lisinopril (PRINIVIL,ZESTRIL) 20 MG tablet TAKE 1 TABLET BY MOUTH DAILY     Reference [id number]

## 2017-12-21 NOTE — Addendum Note (Signed)
Addended by: Juventino Slovak on: 12/21/2017 03:18 PM   Modules accepted: Orders

## 2018-01-13 ENCOUNTER — Other Ambulatory Visit: Payer: Self-pay | Admitting: Physical Medicine and Rehabilitation

## 2018-01-13 DIAGNOSIS — M545 Low back pain, unspecified: Secondary | ICD-10-CM

## 2018-01-21 ENCOUNTER — Ambulatory Visit
Admission: RE | Admit: 2018-01-21 | Discharge: 2018-01-21 | Disposition: A | Discharge status: Home / Self Care | Payer: Medicare Other | Source: Ambulatory Visit | Attending: Physical Medicine and Rehabilitation | Admitting: Physical Medicine and Rehabilitation

## 2018-01-21 DIAGNOSIS — M545 Low back pain, unspecified: Secondary | ICD-10-CM

## 2018-01-25 ENCOUNTER — Telehealth: Payer: Self-pay | Admitting: Cardiovascular Disease

## 2018-01-25 NOTE — Telephone Encounter (Signed)
New Message:        [facility] Pre-operative Risk Assessment    Request for surgical clearance:  1. What type of surgery is being performed? Scalling/Route cleaning or extractions/fillings  2. When is this surgery scheduled? TBD  3. What type of clearance is required (medical clearance vs. Pharmacy clearance to hold med vs. Both)? Pharmacy  4. Are there any medications that need to be held prior to surgery and how long? rivaroxaban (XARELTO) 20 MG TABS tablet  5. Practice name and name of physician performing surgery? [facility]  6. What is your office phone and fax number? [phone] (Fax), [phone]  7. Anesthesia type (None, local, MAC, general) ? local Dr. Wynetta Emery would also like a copy of the pt medication list  Joe [name] 01/25/2018, 2:05 PM  _________________________________________________________________   (provider comments below)

## 2018-01-26 ENCOUNTER — Telehealth: Payer: Self-pay

## 2018-01-26 NOTE — Telephone Encounter (Signed)
   [facility] Pre-operative Risk Assessment    Request for surgical clearance:  1. What type of surgery is being performed?  Left L3, L4, L5, Radiofrequency Ablation   2. When is this surgery scheduled? TBD after clearance    3. What type of clearance is required (medical clearance vs. Pharmacy clearance to hold med vs. Both)? BOTH    4. Are there any medications that need to be held prior to surgery and how long? Xarelto      5. Practice name and name of physician performing surgery? [facility]- Dr. Laroy Apple   6. What is your office phone number [phone] ext [phone]   7.   What is your office fax number  [phone] ATT: X-RAY  8.   Anesthesia type (None, local, MAC, general) ? Unknown   Joe [name] 01/26/2018, 4:15 PM  _________________________________________________________________   (provider comments below)

## 2018-01-27 NOTE — Telephone Encounter (Signed)
Left VM for patient to call back for clinical evaluation.  Will route this request to pharmacy for anticoagulation question.

## 2018-01-27 NOTE — Telephone Encounter (Signed)
Doreene Adas, PA-C spoke with pt re: Dr. Wynetta Emery doing some cleaning / extractions and cardiac clearance for pt. Pt advised Angie that he wasn't happy with Dr. Wynetta Emery and he was going to find another dentist.  We will void this surgical clearance request, per pt request.

## 2018-01-27 NOTE — Telephone Encounter (Signed)
   Primary Cardiologist: Jenkins Rouge, MD  Chart reviewed as part of pre-operative protocol coverage. Patient was contacted 01/27/2018 in reference to pre-operative risk assessment for pending surgery as outlined below.  DENZELL COLASANTI was last seen on 08/18/17 by Dr. Johnsie Cancel.  Since that day, GRAYLING SCHRANZ has done well. His activity is limited by knee and back pain. He can complete greater than 4.0 METS.  Therefore, based on ACC/AHA guidelines, the patient would be at acceptable risk for the planned procedure without further cardiovascular testing.   This request was routed to pharmacy for guidance on xarelto. Once recommendations are received, we will route this recommendation to the requesting party via Epic fax function and remove from pre-op pool.  Please call with questions.  Tami Lin Marilouise Densmore, PA 01/27/2018, 3:18 PM

## 2018-01-28 NOTE — Telephone Encounter (Signed)
Patient with diagnosis of Afib on Xarelto for anticoagulation.    Procedure: Radiofrequency spinal ablation  Date of procedure: TBD  CHADS2-VASc score of  4 (CHF, HTN, AGE, DM2, stroke/tia x 2, CAD, AGE, male)  CrCl 1110ml/min  Per office protocol, patient can hold Xarelto for 3 days prior to procedure.

## 2018-04-10 ENCOUNTER — Other Ambulatory Visit: Payer: Self-pay | Admitting: Cardiology

## 2018-07-01 ENCOUNTER — Other Ambulatory Visit (HOSPITAL_COMMUNITY): Payer: Self-pay | Admitting: Nurse Practitioner

## 2018-07-01 NOTE — Telephone Encounter (Signed)
Pt last saw Dr Johnsie Cancel on 08/18/17, last labs 09/20/17 Creat 1.0, age 77, weight 125.8kg, CrCl 113.57, based on CrCl pt is on appropriate dosage of Xarelto 20mg  QD.  Will refill rx.

## 2018-07-04 ENCOUNTER — Other Ambulatory Visit: Payer: Self-pay | Admitting: Cardiovascular Disease

## 2018-07-04 ENCOUNTER — Other Ambulatory Visit (HOSPITAL_COMMUNITY): Payer: Self-pay | Admitting: Nurse Practitioner

## 2018-07-05 ENCOUNTER — Other Ambulatory Visit (HOSPITAL_COMMUNITY): Payer: Self-pay | Admitting: *Deleted

## 2018-07-05 MED ORDER — RIVAROXABAN 20 MG PO TABS: 20.0000 mg | ORAL_TABLET | Freq: Every day | ORAL | 3 refills | Status: DC

## 2018-07-08 IMAGING — MR MR LUMBAR SPINE W/O CM
4 of 5 series · 18 of 48 positions shown · non-contrast
Comparison: 06/30/2016

CLINICAL DATA: Chronic low back pain for 5 years

EXAM:
MRI LUMBAR SPINE WITHOUT CONTRAST
TECHNIQUE: Multiplanar, multisequence MR imaging of the lumbar spine was
performed. No intravenous contrast was administered.

[Series 6: T2 · sagittal · 4.0mm · 0.73mm/px · 6 of 15 slices shown (1 of 2)]
[im 1/15]
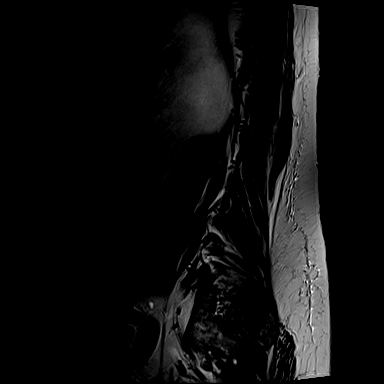
[im 3/15]
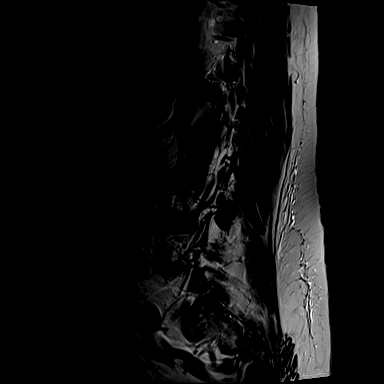
[im 6/15]
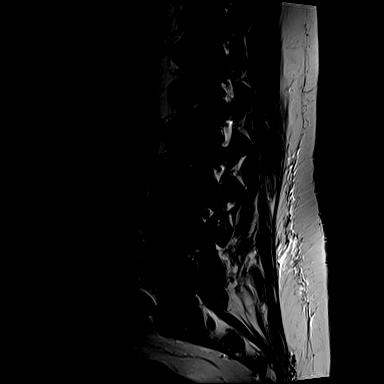
[im 9/15]
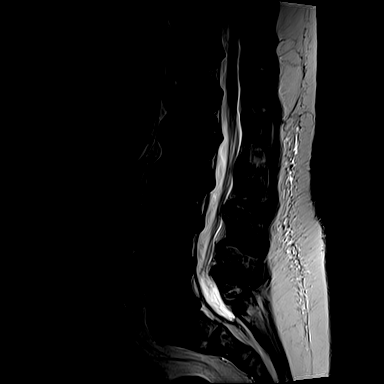
[im 12/15]
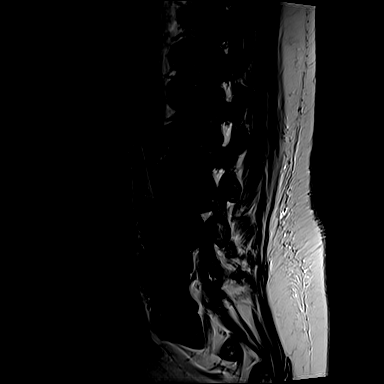
[im 15/15]
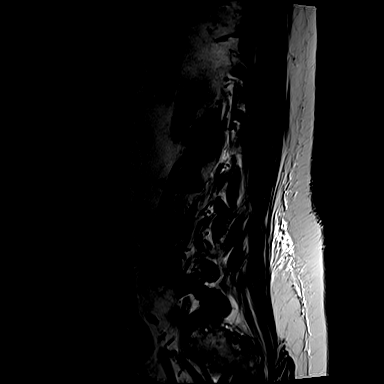

[Series 7: T1 · sagittal · 4.0mm · 0.73mm/px · 3 of 15 slices shown (1 of 2)]
[im 3/15]
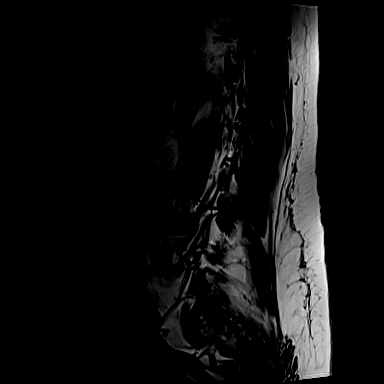
[im 9/15]
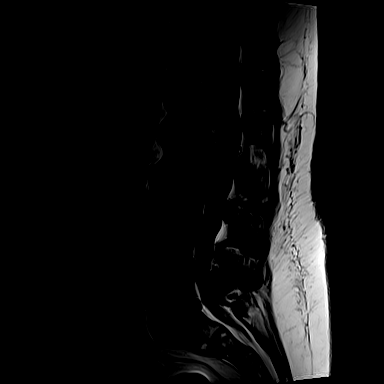
[im 15/15]
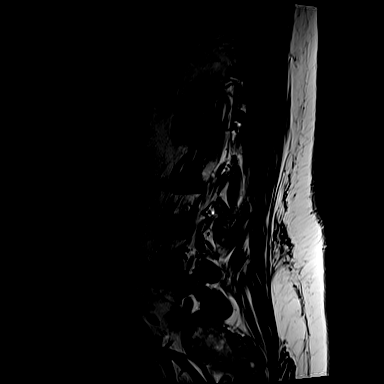

[Series 13: T2 · axial · 4.0mm · 0.28mm/px · z∈[-94,+88]mm · 6 of 41 slices shown (2 of 2)]
[im 1/41]
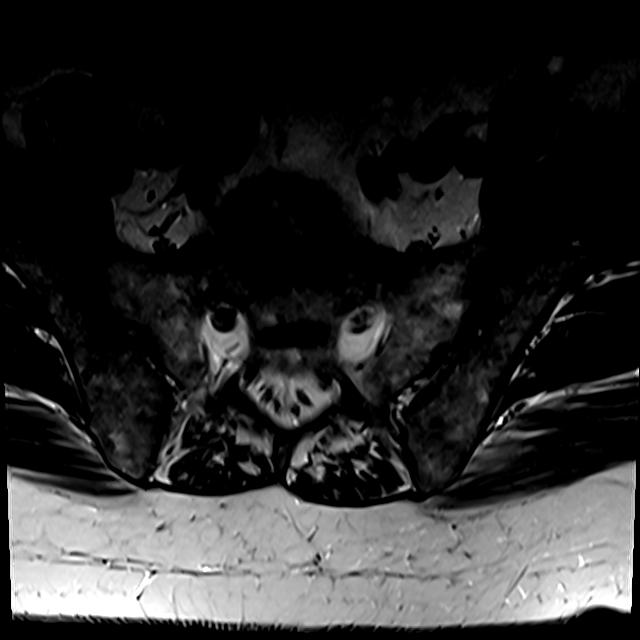
[im 6/41]
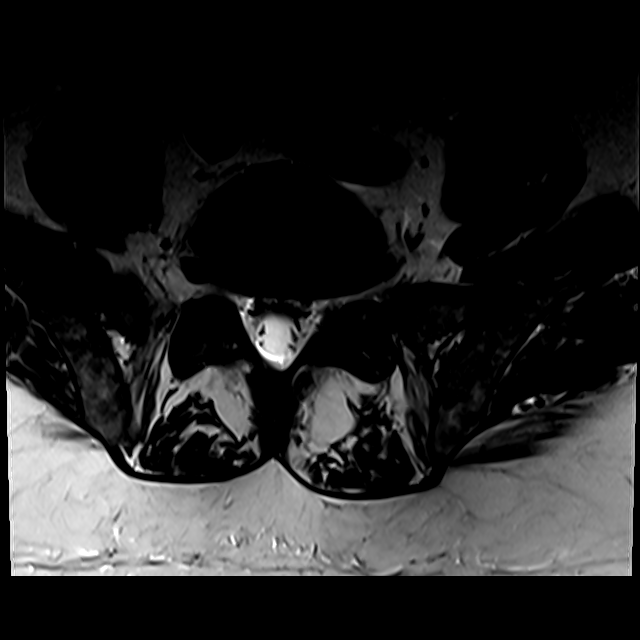
[im 12/41]
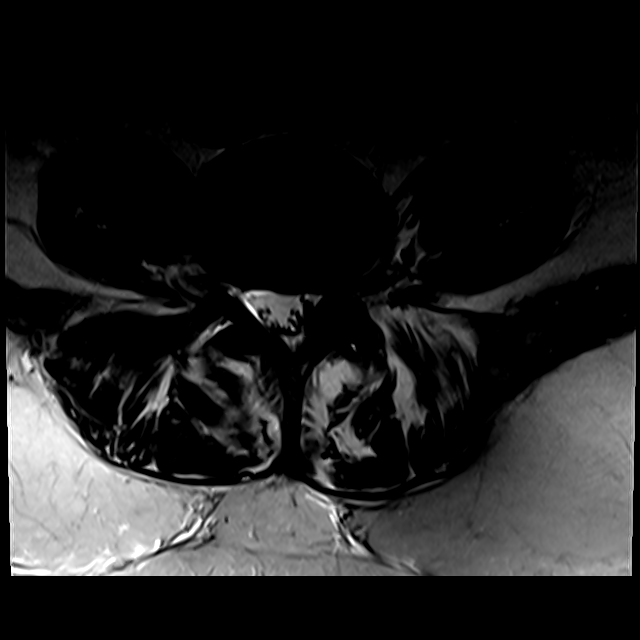
[im 18/41]
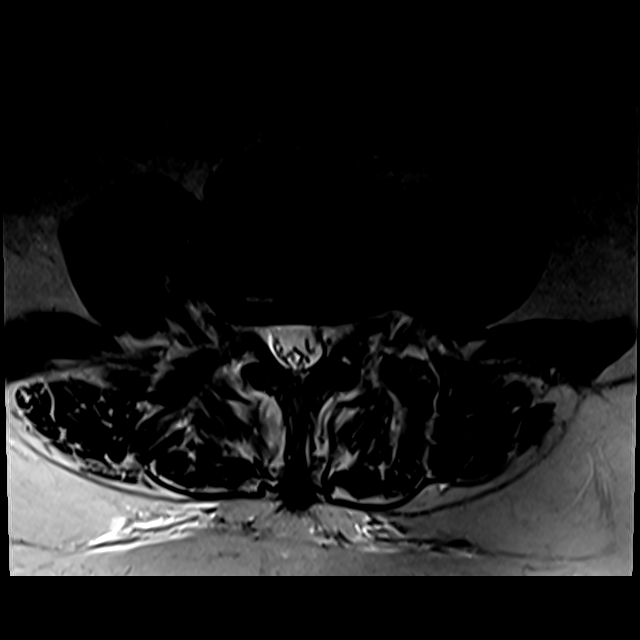
[im 21/41]
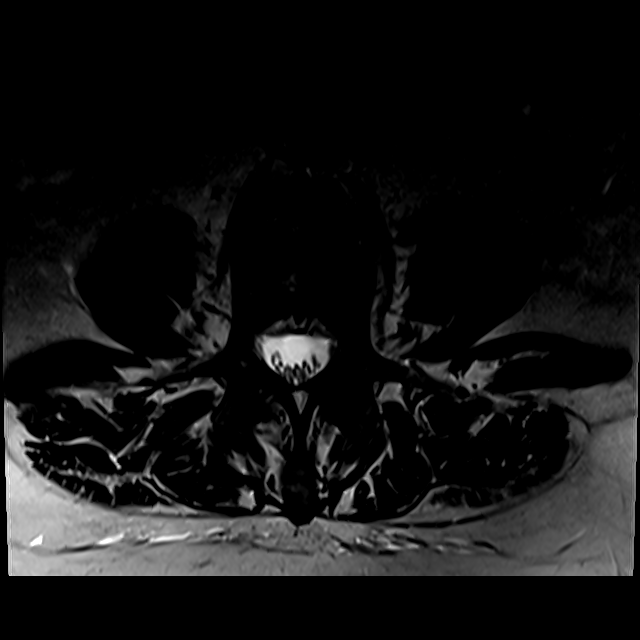
[im 35/41]
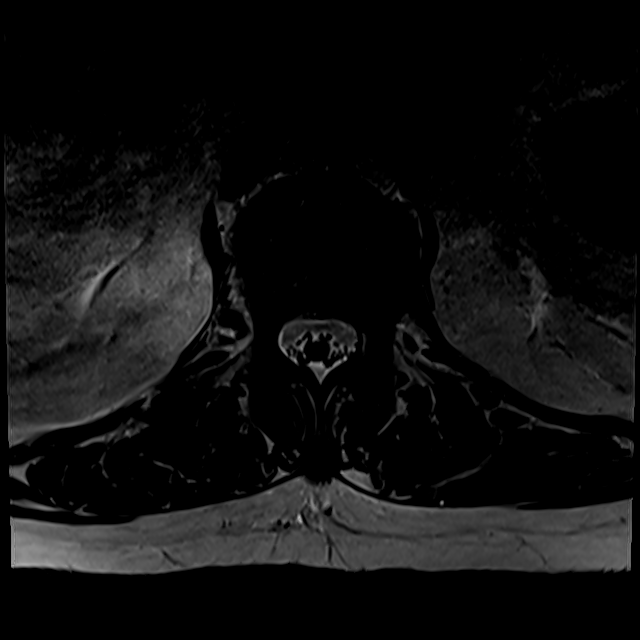

[Series 100: T1 · axial · 4.0mm · 0.28mm/px · z∈[-70,+88]mm · 3 of 41 slices shown (2 of 2)]
[im 6/41]
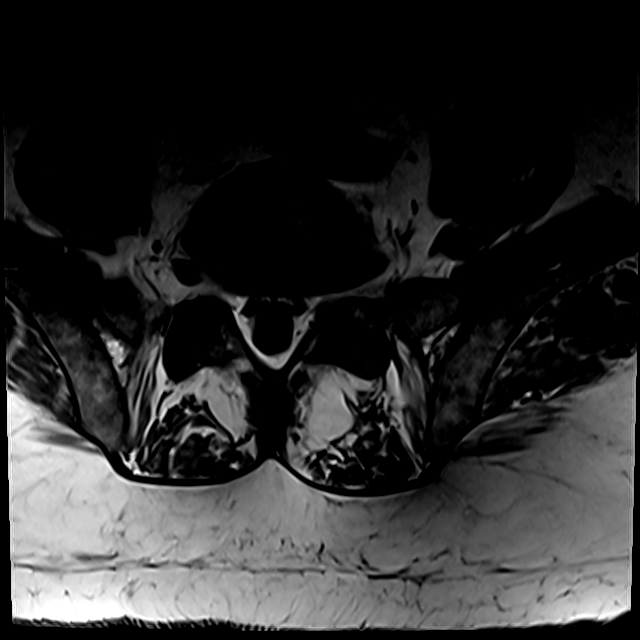
[im 21/41]
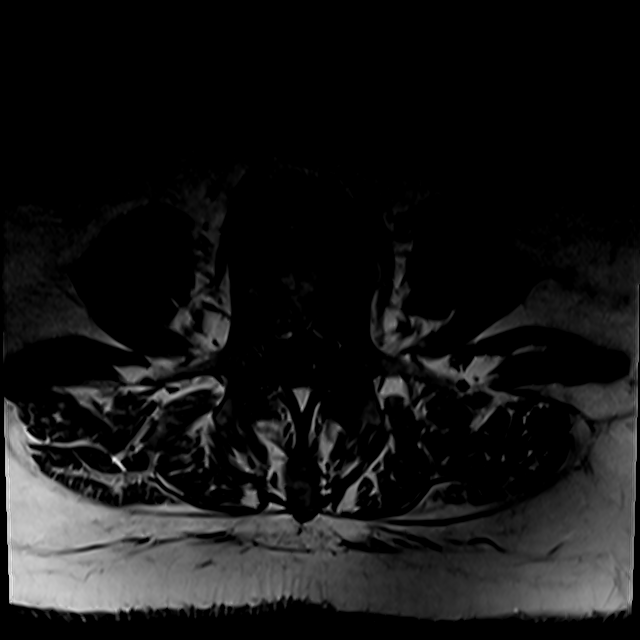
[im 35/41]
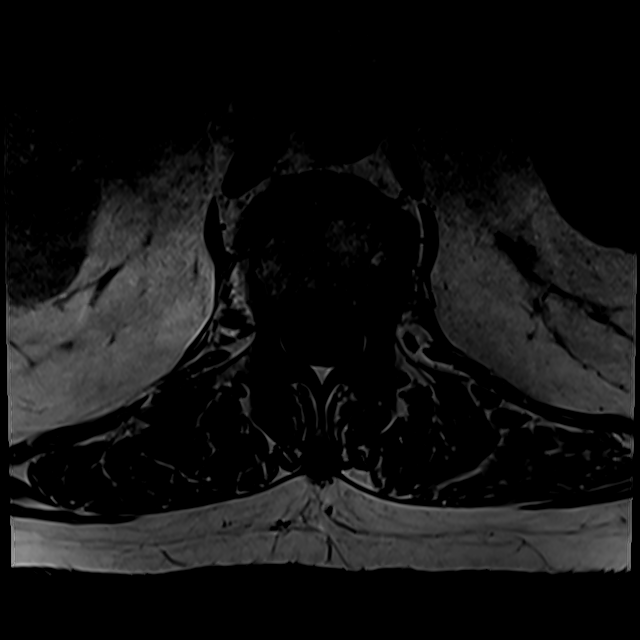

[18 of 48 positions shown; findings below may reference images not displayed]

FINDINGS: Segmentation:  Standard.

Alignment:  2 mm retrolisthesis L2 on L3 and L3 on L4.

Vertebrae: No fracture, evidence of discitis, or bone lesion. Marrow
heterogeneity is present which can be seen with anemia, smoking,
obesity, or advancing age.

Conus medullaris and cauda equina: Conus extends to the T12 level.
Conus and cauda equina appear normal.

Paraspinal and other soft tissues: No acute paraspinal abnormality.

Disc levels:

Disc spaces: Degenerative disc disease disc height loss throughout
the lumbar spine.

T10-T11 and T11-T12: Broad-based disc bulge. Mild bilateral
foraminal narrowing.

T12-L1: Mild broad-based disc bulge. No evidence of neural foraminal
stenosis. No central canal stenosis.

L1-L2: Broad-based disc bulge. No evidence of neural foraminal
stenosis. No central canal stenosis.

L2-L3: Broad-based disc bulge. No evidence of neural foraminal
stenosis. No central canal stenosis.

L3-L4: Broad-based disc bulge. Mild bilateral facet arthropathy. No
evidence of neural foraminal stenosis. No central canal stenosis.

L4-L5: Broad-based disc bulge. Moderate bilateral facet arthropathy.
Mild bilateral facet arthropathy. Left lateral recess narrowing.

L5-S1: Broad-based disc bulge. Moderate bilateral facet arthropathy.
Moderate left foraminal stenosis. No right foraminal stenosis. No
central canal stenosis.
IMPRESSION: 1. Mild lumbar spine spondylosis as described above. No significant
lumbar spine disc protrusion.

## 2018-08-18 ENCOUNTER — Ambulatory Visit (HOSPITAL_COMMUNITY): Payer: Medicare Other | Attending: Cardiology

## 2018-08-18 ENCOUNTER — Other Ambulatory Visit: Payer: Self-pay

## 2018-08-18 DIAGNOSIS — I350 Nonrheumatic aortic (valve) stenosis: Secondary | ICD-10-CM | POA: Diagnosis present

## 2018-09-27 NOTE — Progress Notes (Signed)
Cardiology Office Note   Date:  09/29/2018   ID:  Joe [name], DOB September 30, 1941, MRN [id number]  PCP:  [name], MD  Cardiologist:  Dr. Johnsie Cancel    No chief complaint on file.     History of Present Illness: Joe [name] is a 77 y.o. male who presents for follow up for PAF, diastolic CHF and edema Also history of HTN, OSA RBBB July 2018 with edema TTE 08/18/18 with mild AS normal EF and grade 2 diastolic dysfunction  He saw K. [place], [place] 04/28/17 with c/o shortness of breath and LLE, reports of irregular HB. She placed an event monitor Which showed no arrhythmia BNP was 452 and diuretics escalated with improvement   Trying to do water aerobics. 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 Depression   . Hyperlipidemia   . Hypertension   . Primary localized osteoarthritis of left knee   . Primary localized osteoarthritis of right knee   . Shortness of breath dyspnea   . Sleep apnea    CPAP  . Syncope 12/11/2012    Past Surgical History:  Procedure Laterality Date  . EYE SURGERY Left prosthesis  . KNEE SURGERY Bilateral   . 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 ROTATOR CUFF REPAIR    . TOTAL KNEE ARTHROPLASTY Right 09/17/2014   Procedure: RIGHT TOTAL KNEE ARTHROPLASTY Steroid injection LEFT KNEE;  Surgeon: [name], MD;  Location: [place];  Service: Orthopedics;  Laterality: Right;  . TOTAL KNEE ARTHROPLASTY Left 03/25/2015   Procedure: TOTAL KNEE ARTHROPLASTY;  Surgeon: [name], MD;  Location: [place];  Service: Orthopedics;  Laterality: Left;     Current Outpatient Medications  Medication Sig Dispense Refill  . citalopram (CELEXA) 20 MG tablet Take 20 mg by mouth daily.    . furosemide (LASIX) 40 MG tablet Take 1 tablet (40 mg total) by mouth 2 (two) times daily. Please call and schedule an appointment for further refills 1st attempt 180 tablet 0  .  gabapentin (NEURONTIN) 300 MG capsule Take 300 mg by mouth 3 (three) times daily.    . IRON PO Take 1 tablet by mouth daily.    Marland Kitchen lisinopril (PRINIVIL,ZESTRIL) 20 MG tablet Take 1 tablet (20 mg total) by mouth daily. 90 tablet 1  . loratadine (CLARITIN) 10 MG tablet Take 10 mg by mouth daily as needed for allergies.    . Multiple Vitamin (MV-ONE) CAPS Take 1 capsule by mouth daily.    . Omega-3 Fatty Acids (FISH OIL PO) Take 1 tablet by mouth daily.    . potassium chloride SA (K-DUR,KLOR-CON) 20 MEQ tablet Take 1 tablet (20 mEq total) by mouth daily. Please make yearly appt with Dr. Johnsie Cancel for October before anymore refills. 1st attempt 30 tablet 1  . rivaroxaban (XARELTO) 20 MG TABS tablet Take 1 tablet (20 mg total) by mouth daily with supper. 90 tablet 3  . simvastatin (ZOCOR) 20 MG tablet Take 20 mg by mouth daily.    . Tamsulosin HCl (FLOMAX) 0.4 MG CAPS Take 0.4 mg by mouth daily.    . traMADol (ULTRAM) 50 MG tablet Take 50 mg by mouth daily as needed.    . traZODone (DESYREL) 50 MG tablet Take 50 mg by mouth at bedtime as needed. sleep  3  . verapamil (CALAN-SR) 120 MG CR  tablet Take 120 mg by mouth 3 (three) times daily.     Alveda Reasons 20 MG TABS tablet TAKE 1 TABLET BY MOUTH EVERY DAY WITH SUPPER 30 tablet 6  . metolazone (ZAROXOLYN) 2.5 MG tablet Take 1 tablet (2.5 mg total) by mouth once a week. Only on Wednesday 5 tablet 3   No current facility-administered medications for this visit.     Allergies:   Demerol [meperidine]; Meloxicam; Keflex [cephalexin]; and Penicillins    Social History:  The patient  reports that he quit smoking about 21 years ago. He has never used smokeless tobacco. He reports current alcohol use. He reports that he does not use drugs.   Family History:  The patient's family history includes Diabetes in his maternal grandmother; Heart attack in his brother and father; Heart disease in his mother; Hypertension in his father and mother.    ROS:  General:no  colds or fevers, no weight changes Skin:no rashes or ulcers HEENT:no blurred vision, no congestion CV:see HPI PUL:see HPI GI:no diarrhea constipation or melena, no indigestion GU:no hematuria, no dysuria MS:no joint pain, no claudication Neuro:no syncope, no lightheadedness Endo:no diabetes, no thyroid disease  Wt Readings from Last 3 Encounters:  09/29/18 274 lb (124.3 kg)  08/18/17 277 lb 6.4 oz (125.8 kg)  07/05/17 275 lb 12.8 oz (125.1 kg)     PHYSICAL EXAM: VS:  BP (!) 144/72   Pulse 65   Ht 5\' 7"  (1.702 m)   Wt 274 lb (124.3 kg)   BMI 42.91 kg/m  , BMI Body mass index is 42.91 kg/m. Affect appropriate Obese male  HEENT: normal Neck supple with no adenopathy JVP normal no bruits no thyromegaly Lungs clear with no wheezing and good diaphragmatic motion Heart:  S1/S2 AS murmur, no rub, gallop or click PMI normal Abdomen: benighn, BS positve, no tenderness, no AAA no bruit.  No HSM or HJR Distal pulses intact with no bruits Plus one bilateral  Edema worse on left  Neuro non-focal Skin warm and dry No muscular weakness    EKG:   05/19/17 SR RBBB rate 81 PVC;s 09/29/18 SR rate 65  ICRBBB    Recent Labs: No results found for requested labs within last 8760 hours.    Lipid Panel No results found for: CHOL, TRIG, HDL, CHOLHDL, VLDL, LDLCALC, LDLDIRECT     Other studies Reviewed: Additional studies/ records that were reviewed today include: . ECHO 05/10/17  Study Conclusions  - Left ventricle: The cavity size was at the upper limits of normal. Systolic function was vigorous. The estimated ejection fraction was in the range of 65% to 70%. Wall motion was normal; there were no regional wall motion abnormalities. Left ventricular diastolic function parameters were normal. - Aortic valve: Transvalvular velocity was increased more than expected, due to high cardiac output. There was very mild stenosis. Valve area (VTI): 2.04 cm^2. Valve area  (Vmax): 1.8 cm^2. - Mitral valve: There was mild regurgitation. Valve area by pressure half-time: 1.73 cm^2. Valve area by continuity equation (using LVOT flow): 2.4 cm^2. - Left atrium: The atrium was mildly dilated. - Right ventricle: The cavity size was mildly dilated. Wall thickness was normal. - Right atrium: The atrium was mildly dilated. - Pulmonary arteries: Systolic pressure was mildly increased. PA peak pressure: 35 mm Hg (S).  GXT 09/09/15 Study Highlights  Blood pressure demonstrated a hypertensive response to exercise.  There was no ST segment deviation noted during stress.      ASSESSMENT AND PLAN:  1.  Acute on chronic diastolic HF and in combination with a fib.  Improved continue diuretics  2.  PAF- maintaining SR - event monitor with brief episode of a fib. But otherwise SR. Ok to hold xarelto until sees dentist for gingival bleeding and bad tooth  3. HTN:  Well controlled.  Continue current medications and low sodium Dash type diet.    4. Murmur: mild AS  Mean gradient 10 mmHg peak 20 mmHg  TTE 08/18/18 f/u echo in a year   [employer]

## 2018-09-29 ENCOUNTER — Ambulatory Visit: Payer: Medicare Other | Admitting: Cardiovascular Disease

## 2018-09-29 VITALS — BP 144/72 | HR 65 | Ht 67.0 in | Wt 274.0 lb

## 2018-09-29 DIAGNOSIS — R011 Cardiac murmur, unspecified: Secondary | ICD-10-CM | POA: Diagnosis not present

## 2018-09-29 DIAGNOSIS — I480 Paroxysmal atrial fibrillation: Secondary | ICD-10-CM | POA: Diagnosis not present

## 2018-09-29 DIAGNOSIS — I5033 Acute on chronic diastolic (congestive) heart failure: Secondary | ICD-10-CM

## 2018-09-29 DIAGNOSIS — I10 Essential (primary) hypertension: Secondary | ICD-10-CM

## 2018-09-29 NOTE — Patient Instructions (Signed)
Medication Instructions:  Please hold your Xarelto until you see your Dentist.  If you need a refill on your cardiac medications before your next appointment, please call your pharmacy.   Lab work:  If you have labs (blood work) drawn today and your tests are completely normal, you will receive your results only by: Marland Kitchen MyChart Message (if you have MyChart) OR . A paper copy in the mail If you have any lab test that is abnormal or we need to change your treatment, we will call you to review the results.  Testing/Procedures: NONE at this time.   Follow-Up: At [facility], you and your health needs are our priority.  As part of our continuing mission to provide you with exceptional heart care, we have created designated Provider Care Teams.  These Care Teams include your primary Cardiologist (physician) and Advanced Practice Providers (APPs -  Physician Assistants and Nurse Practitioners) who all work together to provide you with the care you need, when you need it. You will need a follow up appointment in 1 years.  Please call our office 2 months in advance to schedule this appointment.  You may see Jenkins Rouge, MD or one of the following Advanced Practice Providers on your designated Care Team:   Truitt Merle, NP Cecilie Kicks, NP . Kathyrn Drown, NP

## 2018-10-04 NOTE — Addendum Note (Signed)
Addended by: Claude Manges on: 10/04/2018 09:07 AM   Modules accepted: Orders

## 2018-10-09 ENCOUNTER — Other Ambulatory Visit (HOSPITAL_COMMUNITY): Payer: Self-pay | Admitting: Nurse Practitioner

## 2018-11-15 ENCOUNTER — Other Ambulatory Visit: Payer: Self-pay

## 2018-11-15 MED ORDER — LISINOPRIL 20 MG PO TABS: 20.0000 mg | ORAL_TABLET | Freq: Every day | ORAL | 2 refills | Status: DC

## 2019-03-27 ENCOUNTER — Telehealth: Payer: Self-pay

## 2019-03-27 NOTE — Telephone Encounter (Signed)
   [facility] Pre-operative Risk Assessment    Request for surgical clearance:  1. What type of surgery is being performed? Radiofrequency Ablation   2. When is this surgery scheduled? TBD   3. What type of clearance is required (medical clearance vs. Pharmacy clearance to hold med vs. Both)? Pharmacy  4. Are there any medications that need to be held prior to surgery and how long? Xarelto   5. Practice name and name of physician performing surgery? [facility]  6. What is your office phone number [phone]    7.   What is your office fax number [phone]  8.   Anesthesia type (None, local, MAC, general) ? None listed   Mady Haagensen 03/27/2019, 2:17 PM  _________________________________________________________________   (provider comments below)

## 2019-03-27 NOTE — Telephone Encounter (Signed)
Patient with diagnosis of afib on Xarelto for anticoagulation.    Procedure: Radiofrequency Ablation  Date of procedure: TBD  CHADS2-VASc score of  4 (CHF, HTN, AGE, DM2, stroke/tia x 2, CAD, AGE, male)  CrCl 59ml/min  Per office protocol, patient can hold Xarelto for 3 days prior to procedure.

## 2019-03-27 NOTE — Telephone Encounter (Signed)
   Primary Cardiologist: Jenkins Rouge, MD  Chart reviewed as part of pre-operative protocol coverage. Given past medical history and time since last visit, based on ACC/AHA guidelines, Joe [name] would be at acceptable risk for the planned procedure without further cardiovascular testing.   Per office protocol, patient can hold Xarelto for 3 days prior to procedure.    I will route this recommendation to the requesting party via Epic fax function and remove from pre-op pool.  Please call with questions.  Lyda Jester, PA-C 03/27/2019, 4:13 PM

## 2019-06-05 ENCOUNTER — Other Ambulatory Visit: Payer: Self-pay | Admitting: Cardiovascular Disease

## 2019-08-18 ENCOUNTER — Other Ambulatory Visit (HOSPITAL_COMMUNITY): Payer: Self-pay | Admitting: Nurse Practitioner

## 2019-08-18 NOTE — Telephone Encounter (Signed)
Xarelto 20mg  refill request received. Pt is 78 years old, weight-124.3kg, Crea-0.96 on 05/11/2019 via [facility] at [facility] PCP, last seen by Dr. Johnsie Cancel on 09/29/2018, Diagnosis-Afib, CrCl-115.59ml/min; Dose is appropriate based on dosing criteria. Will send in refill to requested pharmacy.

## 2019-09-20 NOTE — Progress Notes (Signed)
Cardiology Office Note   Date:  09/25/2019   ID:  [name], DOB 11-23-1940, MRN [id number]  PCP:  Joe Neer, MD  Cardiologist:  Dr. Johnsie Cancel    No chief complaint on file.     History of Present Illness: Joe [name] is a 78 y.o. male who presents for follow up for PAF, diastolic CHF and edema Also history of HTN, OSA RBBB July 2018 with edema TTE 08/18/18 with mild AS normal EF and grade 2 diastolic dysfunction  He saw Joe [name], [place] 04/28/17 with c/o shortness of breath and LLE, reports of irregular HB. 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 Has a Shitzu that is 77 yo he walks but eating too much Wife has Alzheimer's and he only has a sitter twice/week to help out   Past Medical History:  Diagnosis Date  . Depression   . Hyperlipidemia   . Hypertension   . Primary localized osteoarthritis of left knee   . Primary localized osteoarthritis of right knee   . Shortness of breath dyspnea   . Sleep apnea    CPAP  . Syncope 12/11/2012    Past Surgical History:  Procedure Laterality Date  . EYE SURGERY Left prosthesis  . KNEE SURGERY Bilateral   . 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 ROTATOR CUFF REPAIR    . TOTAL KNEE ARTHROPLASTY Right 09/17/2014   Procedure: RIGHT TOTAL KNEE ARTHROPLASTY Steroid injection LEFT KNEE;  Surgeon: Joe Junes, MD;  Location: [place];  Service: Orthopedics;  Laterality: Right;  . TOTAL KNEE ARTHROPLASTY Left 03/25/2015   Procedure: TOTAL KNEE ARTHROPLASTY;  Surgeon: Joe Saas, MD;  Location: [place];  Service: Orthopedics;  Laterality: Left;     Current Outpatient Medications  Medication Sig Dispense Refill  . citalopram (CELEXA) 20 MG tablet Take 20 mg by mouth daily.    . furosemide (LASIX) 40 MG tablet Take  1 tablet (40 mg total) by mouth 2 (two) times daily. Please call and schedule an appointment for further refills 1st attempt 180 tablet 0  . IRON PO Take 1 tablet by mouth daily.    Marland Kitchen loratadine (CLARITIN) 10 MG tablet Take 10 mg by mouth daily as needed for allergies.    . Multiple Vitamin (MV-ONE) CAPS Take 1 capsule by mouth daily.    Marland Kitchen olmesartan (BENICAR) 40 MG tablet Take 40 mg by mouth daily.    . Omega-3 Fatty Acids (FISH OIL PO) Take 1 tablet by mouth daily.    . potassium chloride SA (K-DUR,KLOR-CON) 20 MEQ tablet Take 1 tablet (20 mEq total) by mouth daily. 60 tablet 11  . simvastatin (ZOCOR) 20 MG tablet Take 20 mg by mouth daily.    . Tamsulosin HCl (FLOMAX) 0.4 MG CAPS Take 0.4 mg by mouth daily.    . traMADol (ULTRAM) 50 MG tablet Take 50 mg by mouth daily as needed.    . traZODone (DESYREL) 50 MG tablet Take 50 mg by mouth at bedtime as needed. sleep  3  . verapamil (CALAN-SR) 120 MG CR tablet Take 120 mg by mouth 3 (three) times daily.     Alveda Reasons 20 MG TABS tablet TAKE 1 TABLET BY MOUTH  DAILY WITH SUPPER 90 tablet 2  . metolazone (ZAROXOLYN) 2.5 MG tablet Take 1 tablet (2.5 mg total) by mouth once a week. Only on Wednesday 5 tablet 3  . rivaroxaban (XARELTO) 20 MG TABS tablet Take 1 tablet (20 mg total) by mouth daily with supper. 90 tablet 3   No current facility-administered medications for this visit.     Allergies:   Demerol [meperidine], Meloxicam, Keflex [cephalexin], and Penicillins    Social History:  The patient  reports that he quit smoking about 22 years ago. He has never used smokeless tobacco. He reports current alcohol use. He reports that he does not use drugs.   Family History:  The patient's family history includes Diabetes in his maternal grandmother; Heart attack in his brother and father; Heart disease in his mother; Hypertension in his father and mother.    ROS:  General:no colds or fevers, no weight changes Skin:no rashes or ulcers HEENT:no  blurred vision, no congestion CV:see HPI PUL:see HPI GI:no diarrhea constipation or melena, no indigestion GU:no hematuria, no dysuria MS:no joint pain, no claudication Neuro:no syncope, no lightheadedness Endo:no diabetes, no thyroid disease  Wt Readings from Last 3 Encounters:  09/25/19 276 lb (125.2 kg)  09/29/18 274 lb (124.3 kg)  08/18/17 277 lb 6.4 oz (125.8 kg)     PHYSICAL EXAM: VS:  BP 128/76   Pulse 73   Ht 5\' 6"  (1.676 m)   Wt 276 lb (125.2 kg)   SpO2 97%   BMI 44.55 kg/m  , BMI Body mass index is 44.55 kg/m. Affect appropriate Obese male  HEENT: normal Neck supple with no adenopathy JVP normal no bruits no thyromegaly Lungs clear with no wheezing and good diaphragmatic motion Heart:  S1/S2 AS murmur, no rub, gallop or click PMI normal Abdomen: benighn, BS positve, no tenderness, no AAA no bruit.  No HSM or HJR Distal pulses intact with no bruits Plus one bilateral  Edema worse on left  Neuro non-focal Skin warm and dry No muscular weakness    EKG:   05/19/17 SR RBBB rate 81 PVC;s 09/29/18 SR rate 65  ICRBBB    Recent Labs: No results found for requested labs within last 8760 hours.    Lipid Panel No results found for: CHOL, TRIG, HDL, CHOLHDL, VLDL, LDLCALC, LDLDIRECT     Other studies Reviewed: Additional studies/ records that were reviewed today include: . ECHO 05/10/17  Study Conclusions  - Left ventricle: The cavity size was at the upper limits of normal. Systolic function was vigorous. The estimated ejection fraction was in the range of 65% to 70%. Wall motion was normal; there were no regional wall motion abnormalities. Left ventricular diastolic function parameters were normal. - Aortic valve: Transvalvular velocity was increased more than expected, due to high cardiac output. There was very mild stenosis. Valve area (VTI): 2.04 cm^2. Valve area (Vmax): 1.8 cm^2. - Mitral valve: There was mild regurgitation. Valve  area by pressure half-time: 1.73 cm^2. Valve area by continuity equation (using LVOT flow): 2.4 cm^2. - Left atrium: The atrium was mildly dilated. - Right ventricle: The cavity size was mildly dilated. Wall thickness was normal. - Right atrium: The atrium was mildly dilated. - Pulmonary arteries: Systolic pressure was mildly increased. PA peak pressure: 35 mm Hg (S).  GXT 09/09/15 Study Highlights  Blood pressure demonstrated a hypertensive response to exercise.  There was no ST segment deviation noted during stress.      ASSESSMENT AND PLAN:  1.  Acute  on chronic diastolic HF and in combination with a fib.  Improved continue diuretics  2.  PAF- maintaining SR.  On xarelto and verapamil   3. HTN:  Well controlled.  Continue current medications and low sodium Dash type diet.    4. Murmur: mild AS  Mean gradient 10 mmHg peak 20 mmHg  TTE 08/18/18 f/u echo ordered   Jenkins Rouge

## 2019-09-25 ENCOUNTER — Other Ambulatory Visit: Payer: Self-pay

## 2019-09-25 ENCOUNTER — Ambulatory Visit: Payer: Medicare Other | Admitting: Cardiovascular Disease

## 2019-09-25 ENCOUNTER — Encounter: Payer: Self-pay | Admitting: Cardiovascular Disease

## 2019-09-25 VITALS — BP 128/76 | HR 73 | Ht 66.0 in | Wt 276.0 lb

## 2019-09-25 DIAGNOSIS — I10 Essential (primary) hypertension: Secondary | ICD-10-CM

## 2019-09-25 DIAGNOSIS — R011 Cardiac murmur, unspecified: Secondary | ICD-10-CM

## 2019-09-25 DIAGNOSIS — I480 Paroxysmal atrial fibrillation: Secondary | ICD-10-CM

## 2019-09-25 DIAGNOSIS — I5033 Acute on chronic diastolic (congestive) heart failure: Secondary | ICD-10-CM | POA: Diagnosis not present

## 2019-09-25 DIAGNOSIS — I350 Nonrheumatic aortic (valve) stenosis: Secondary | ICD-10-CM

## 2019-09-25 NOTE — Patient Instructions (Addendum)
Medication Instructions:   *If you need a refill on your cardiac medications before your next appointment, please call your pharmacy*  Lab Work:  If you have labs (blood work) drawn today and your tests are completely normal, you will receive your results only by: Marland Kitchen MyChart Message (if you have MyChart) OR . A paper copy in the mail If you have any lab test that is abnormal or we need to change your treatment, we will call you to review the results.  Testing/Procedures: Your physician has requested that you have an echocardiogram. Echocardiography is a painless test that uses sound waves to create images of your heart. It provides your doctor with information about the size and shape of your heart and how well your heart's chambers and valves are working. This procedure takes approximately one hour. There are no restrictions for this procedure.  Follow-Up: At [facility], you and your health needs are our priority.  As part of our continuing mission to provide you with exceptional heart care, we have created designated Provider Care Teams.  These Care Teams include your primary Cardiologist (physician) and Advanced Practice Providers (APPs -  Physician Assistants and Nurse Practitioners) who all work together to provide you with the care you need, when you need it.  Your next appointment:   6 months  The format for your next appointment:   In Person  Provider:   You may see Jenkins Rouge, MD or one of the following Advanced Practice Providers on your designated Care Team:    Truitt Merle, NP  Cecilie Kicks, NP  Kathyrn Drown, NP

## 2019-10-05 ENCOUNTER — Other Ambulatory Visit: Payer: Self-pay

## 2019-10-05 ENCOUNTER — Ambulatory Visit (HOSPITAL_COMMUNITY): Payer: Medicare Other | Attending: Cardiology

## 2019-10-05 DIAGNOSIS — I350 Nonrheumatic aortic (valve) stenosis: Secondary | ICD-10-CM

## 2019-10-05 DIAGNOSIS — I480 Paroxysmal atrial fibrillation: Secondary | ICD-10-CM | POA: Diagnosis present

## 2019-10-05 DIAGNOSIS — I10 Essential (primary) hypertension: Secondary | ICD-10-CM

## 2019-10-05 DIAGNOSIS — R011 Cardiac murmur, unspecified: Secondary | ICD-10-CM

## 2019-10-05 DIAGNOSIS — I5033 Acute on chronic diastolic (congestive) heart failure: Secondary | ICD-10-CM

## 2019-10-09 ENCOUNTER — Telehealth: Payer: Self-pay

## 2019-10-09 DIAGNOSIS — I350 Nonrheumatic aortic (valve) stenosis: Secondary | ICD-10-CM

## 2019-10-09 NOTE — Telephone Encounter (Signed)
Patient aware of results. Will place order for echo in one year. 

## 2019-10-09 NOTE — Telephone Encounter (Signed)
-----   Message from Josue Hector, MD sent at 10/06/2019  9:01 AM EST ----- Mild AS stable f/u echo in a year

## 2020-02-12 ENCOUNTER — Other Ambulatory Visit: Payer: Self-pay

## 2020-02-12 MED ORDER — POTASSIUM CHLORIDE CRYS ER 20 MEQ PO TBCR: 20.0000 meq | EXTENDED_RELEASE_TABLET | Freq: Every day | ORAL | 2 refills | Status: DC

## 2020-02-26 ENCOUNTER — Other Ambulatory Visit: Payer: Self-pay | Admitting: Cardiovascular Disease

## 2020-04-08 NOTE — Progress Notes (Signed)
Cardiology Office Note   Date:  04/10/2020   ID:  Joe [name], DOB [birth year], MRN [id number]  PCP:  [name], MD  Cardiologist:  Dr. Johnsie Cancel    No chief complaint on file.     History of Present Illness: Joe [name] is a 79 y.o. male who presents for follow up for AS,  PAF, diastolic CHF and edema Also history of HTN, OSA RBBB  July 2018 Had edema TTE 08/18/18 with mild AS normal EF and grade 2 diastolic dysfunction  He saw [name], [place] 04/28/17 with c/o shortness of breath and LLE, reports of irregular HB. 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 Has a Shitzu that is 78 yo he walks but eating too much Wife has Alzheimer's and he only has a sitter twice/week to help out   TTE 10/05/19 with EF 65-70% only relaxation abnormality and mild AS mean gradient 14 peak 24 mmHg   Has had COVID vaccine   Past Medical History:  Diagnosis Date  . Depression   . Hyperlipidemia   . Hypertension   . Primary localized osteoarthritis of left knee   . Primary localized osteoarthritis of right knee   . Shortness of breath dyspnea   . Sleep apnea    CPAP  . Syncope 12/11/2012    Past Surgical History:  Procedure Laterality Date  . EYE SURGERY Left prosthesis  . KNEE SURGERY Bilateral   . 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 ROTATOR CUFF REPAIR    . TOTAL KNEE ARTHROPLASTY Right 09/17/2014   Procedure: RIGHT TOTAL KNEE ARTHROPLASTY Steroid injection LEFT KNEE;  Surgeon: [name], MD;  Location: [place];  Service: Orthopedics;  Laterality: Right;  . TOTAL KNEE ARTHROPLASTY Left 03/25/2015   Procedure: TOTAL KNEE ARTHROPLASTY;  Surgeon: [name], MD;  Location: [place];  Service: Orthopedics;  Laterality: Left;     Current Outpatient Medications  Medication Sig Dispense Refill  . citalopram (CELEXA) 20 MG tablet Take 20  mg by mouth daily.    . furosemide (LASIX) 40 MG tablet Take 1 tablet (40 mg total) by mouth 2 (two) times daily. Please call and schedule an appointment for further refills 1st attempt 180 tablet 0  . IRON PO Take 1 tablet by mouth daily.    Marland Kitchen loratadine (CLARITIN) 10 MG tablet Take 10 mg by mouth daily as needed for allergies.    . Multiple Vitamin (MV-ONE) CAPS Take 1 capsule by mouth daily.    Marland Kitchen olmesartan (BENICAR) 40 MG tablet Take 40 mg by mouth daily.    . Omega-3 Fatty Acids (FISH OIL PO) Take 1 tablet by mouth daily.    . potassium chloride SA (KLOR-CON) 20 MEQ tablet Take 1 tablet (20 mEq total) by mouth daily. 90 tablet 2  . simvastatin (ZOCOR) 20 MG tablet Take 20 mg by mouth daily.    . Tamsulosin HCl (FLOMAX) 0.4 MG CAPS Take 0.4 mg by mouth daily.    . traMADol (ULTRAM) 50 MG tablet Take 50 mg by mouth daily as needed.    . traZODone (DESYREL) 50 MG tablet Take 50 mg by mouth at bedtime as needed. sleep  3  . verapamil (CALAN-SR) 120 MG CR tablet Take 120 mg by mouth 3 (three) times daily.     Marland Kitchen  XARELTO 20 MG TABS tablet TAKE 1 TABLET BY MOUTH  DAILY WITH SUPPER 90 tablet 2   No current facility-administered medications for this visit.    Allergies:   Demerol [meperidine], Meloxicam, Keflex [cephalexin], and Penicillins    Social History:  The patient  reports that he quit smoking about 23 years ago. He has never used smokeless tobacco. He reports current alcohol use. He reports that he does not use drugs.   Family History:  The patient's family history includes Diabetes in his maternal grandmother; Heart attack in his brother and father; Heart disease in his mother; Hypertension in his father and mother.    ROS:  General:no colds or fevers, no weight changes Skin:no rashes or ulcers HEENT:no blurred vision, no congestion CV:see HPI PUL:see HPI GI:no diarrhea constipation or melena, no indigestion GU:no hematuria, no dysuria MS:no joint pain, no claudication Neuro:no  syncope, no lightheadedness Endo:no diabetes, no thyroid disease  Wt Readings from Last 3 Encounters:  04/10/20 276 lb (125.2 kg)  09/25/19 276 lb (125.2 kg)  09/29/18 274 lb (124.3 kg)     PHYSICAL EXAM: VS:  BP 126/68   Pulse 61   Ht 5\' 7"  (1.702 m)   Wt 276 lb (125.2 kg)   SpO2 96%   BMI 43.23 kg/m  , BMI Body mass index is 43.23 kg/m.   Affect appropriate Obese male  HEENT: normal Neck supple with no adenopathy JVP normal no bruits no thyromegaly Lungs clear with no wheezing and good diaphragmatic motion Heart:  S1/S2 AS murmur, no rub, gallop or click PMI normal Abdomen: benighn, BS positve, no tenderness, no AAA no bruit.  No HSM or HJR Distal pulses intact with no bruits Plus one bilateral  Edema worse on left  Neuro non-focal Skin warm and dry No muscular weakness  EKG:   05/19/17 SR RBBB rate 81 PVC;s 09/29/18 SR rate 65  ICRBBB   Recent Labs: No results found for requested labs within last 8760 hours.    Lipid Panel No results found for: CHOL, TRIG, HDL, CHOLHDL, VLDL, LDLCALC, LDLDIRECT     Other studies Reviewed: Additional studies/ records that were reviewed today include: . ECHO 10/05/19  IMPRESSIONS    1. Left ventricular ejection fraction, by visual estimation, is 65 to  70%. The left ventricle has hyperdynamic function. There is no left  ventricular hypertrophy.  2. Left ventricular diastolic parameters are consistent with Grade I  diastolic dysfunction (impaired relaxation).  3. The left ventricle has no regional wall motion abnormalities.  4. Global right ventricle has normal systolic function.The right  ventricular size is normal. No increase in right ventricular wall  thickness.  5. Left atrial size was moderately dilated.  6. Right atrial size was normal.  7. The mitral valve is normal in structure. Trivial mitral valve  regurgitation. No evidence of mitral stenosis.  8. The tricuspid valve is normal in structure. Tricuspid  valve  regurgitation is mild.  9. The aortic valve is normal in structure. Aortic valve regurgitation is  not visualized. Mild aortic valve stenosis.  10. The pulmonic valve was normal in structure. Pulmonic valve  regurgitation is not visualized.  11. Mildly elevated pulmonary artery systolic pressure.  12. The tricuspid regurgitant velocity is 2.74 m/s, and with an assumed  right atrial pressure of 3 mmHg, the estimated right ventricular systolic  pressure is mildly elevated at 33.0 mmHg.  13. The inferior vena cava is normal in size with greater than 50%  respiratory variability,  suggesting right atrial pressure of 3 mmHg.   GXT 09/09/15 Study Highlights  Blood pressure demonstrated a hypertensive response to exercise.  There was no ST segment deviation noted during stress.      ASSESSMENT AND PLAN:  1.  Acute on chronic diastolic HF and in combination with a fib.  Improved continue diuretics  2.  PAF- maintaining SR.  On xarelto and verapamil   3. HTN:  Well controlled.  Continue current medications and low sodium Dash type diet.    4. Murmur: TTE 10/05/19  mild AS  Mean gradient 14 mmHg peak 24 mmHg  TTE December 2022 unless new symptoms or change in murmur   F/U in a year   Jenkins Rouge

## 2020-04-10 ENCOUNTER — Encounter: Payer: Self-pay | Admitting: Cardiovascular Disease

## 2020-04-10 ENCOUNTER — Other Ambulatory Visit: Payer: Self-pay

## 2020-04-10 ENCOUNTER — Ambulatory Visit: Payer: Medicare Other | Admitting: Cardiovascular Disease

## 2020-04-10 VITALS — BP 126/68 | HR 61 | Ht 67.0 in | Wt 276.0 lb

## 2020-04-10 DIAGNOSIS — I350 Nonrheumatic aortic (valve) stenosis: Secondary | ICD-10-CM

## 2020-04-10 DIAGNOSIS — I480 Paroxysmal atrial fibrillation: Secondary | ICD-10-CM

## 2020-04-10 NOTE — Patient Instructions (Signed)

## 2020-04-25 ENCOUNTER — Other Ambulatory Visit: Payer: Self-pay

## 2020-04-25 ENCOUNTER — Ambulatory Visit: Payer: Medicare Other | Admitting: Podiatry

## 2020-04-25 ENCOUNTER — Encounter: Payer: Self-pay | Admitting: Podiatry

## 2020-04-25 ENCOUNTER — Ambulatory Visit (INDEPENDENT_AMBULATORY_CARE_PROVIDER_SITE_OTHER): Payer: Medicare Other

## 2020-04-25 DIAGNOSIS — M722 Plantar fascial fibromatosis: Secondary | ICD-10-CM

## 2020-04-25 MED ORDER — CELECOXIB 100 MG PO CAPS: 100.0000 mg | ORAL_CAPSULE | Freq: Two times a day (BID) | ORAL | 3 refills | Status: DC

## 2020-04-25 MED ORDER — METHYLPREDNISOLONE 4 MG PO TBPK: ORAL_TABLET | ORAL | 0 refills | Status: DC

## 2020-04-25 NOTE — Patient Instructions (Signed)

## 2020-04-25 NOTE — Progress Notes (Signed)
He presents today with a chief complaint of pain to his left heel.  He has been aching for the past few months morning is particularly bad.  Objective: Vital signs are stable he is alert oriented x3 pulses are palpable.  He has pain on palpation medial calcaneal tubercle of the left heel.  Radiographs taken today demonstrate soft tissue increase in density plantar fascial kidney insertion site.  Assessment: Plantar fasciitis right.  Plan: I injected him today 20 mg Kenalog 5 mg Marcaine point maximal tenderness.  Start him on Medrol Dosepak to be followed by meloxicam.  In a plantar fascial brace discussed appropriate shoe gear stretching exercises and ice therapy we will follow-up with him in 1 month

## 2020-05-13 ENCOUNTER — Encounter (HOSPITAL_COMMUNITY): Payer: Self-pay | Admitting: Emergency Medicine

## 2020-05-13 ENCOUNTER — Emergency Department (HOSPITAL_COMMUNITY)
Admission: EM | Admit: 2020-05-13 | Discharge: 2020-05-13 | Disposition: A | Discharge status: Home / Self Care | Payer: Medicare Other | Attending: Emergency Medicine | Admitting: Emergency Medicine

## 2020-05-13 ENCOUNTER — Other Ambulatory Visit: Payer: Self-pay

## 2020-05-13 DIAGNOSIS — Z7901 Long term (current) use of anticoagulants: Secondary | ICD-10-CM | POA: Diagnosis not present

## 2020-05-13 DIAGNOSIS — Y9289 Other specified places as the place of occurrence of the external cause: Secondary | ICD-10-CM | POA: Insufficient documentation

## 2020-05-13 DIAGNOSIS — Y939 Activity, unspecified: Secondary | ICD-10-CM | POA: Diagnosis not present

## 2020-05-13 DIAGNOSIS — Z87891 Personal history of nicotine dependence: Secondary | ICD-10-CM | POA: Insufficient documentation

## 2020-05-13 DIAGNOSIS — I10 Essential (primary) hypertension: Secondary | ICD-10-CM | POA: Insufficient documentation

## 2020-05-13 DIAGNOSIS — S01311A Laceration without foreign body of right ear, initial encounter: Secondary | ICD-10-CM | POA: Diagnosis present

## 2020-05-13 DIAGNOSIS — W06XXXA Fall from bed, initial encounter: Secondary | ICD-10-CM | POA: Diagnosis not present

## 2020-05-13 DIAGNOSIS — Y999 Unspecified external cause status: Secondary | ICD-10-CM | POA: Insufficient documentation

## 2020-05-13 DIAGNOSIS — Z79899 Other long term (current) drug therapy: Secondary | ICD-10-CM | POA: Diagnosis not present

## 2020-05-13 MED ORDER — LIDOCAINE HCL 2 % IJ SOLN
10.0000 mL | Freq: Once | INTRAMUSCULAR | Status: AC
  Administered 2020-05-13: 200 mg
  Filled 2020-05-13: qty 20

## 2020-05-13 NOTE — Discharge Instructions (Signed)
Leave the bandage on till tomorrow before you take it off, then you can just leave it open to the air.  Do not soak or scrub.  If it starts bleeding profusely or becoming really red and swollen return to the ER.  If not follow up with your regular doctor.  Take tylenol as needed for pain.  There are 17 sutures that need to be removed.

## 2020-05-13 NOTE — ED Provider Notes (Signed)
[facility] DEPT Provider Note   CSN: [id number] Arrival date & time: 05/13/20  0539     History Chief Complaint  Patient presents with  . Ear Laceration    Joe [name] is a 79 y.o. male.  Patient is a 79 year old male with a history of hypertension, hyperlipidemia, sleep apnea, syncope who is on Xarelto regularly presenting today after he fell out of bed and hit his ear.  Patient reports that he was closer to the edge than he thought and when he rolled over to turn off the alarm his left ear hit the corner of the nightstand and he fell to the ground.  He denies hitting his head or loss of consciousness.  He had no injuries to his arms and legs and has been able to walk without difficulty.  He denies any change in his vision.  He does has continued bleeding and pain of the left ear.  Hearing is normal.  Tetanus shot is up-to-date.  Pain is only significant when touching the area or trying to move the ear.  The history is provided by the patient.       Past Medical History:  Diagnosis Date  . Depression   . Hyperlipidemia   . Hypertension   . Primary localized osteoarthritis of left knee   . Primary localized osteoarthritis of right knee   . Shortness of breath dyspnea   . Sleep apnea    CPAP  . Syncope 12/11/2012    Patient Active Problem List   Diagnosis Date Noted  . Primary localized osteoarthritis of left knee   . DJD (degenerative joint disease) of knee 09/17/2014  . Primary localized osteoarthritis of right knee   . Sleep apnea   . Preop cardiovascular exam 06/07/2014  . RBBB 06/07/2014  . 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 Benign paroxysmal positional vertigo 12/13/2012  . Syncope and collapse 12/11/2012  . HTN (hypertension) 12/11/2012  . HLD (hyperlipidemia) 12/11/2012  . Depression 12/11/2012  . Hypokalemia 12/11/2012    Past Surgical History:  Procedure Laterality Date  . EYE SURGERY Left prosthesis  . KNEE  SURGERY Bilateral   . 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 ROTATOR CUFF REPAIR    . TOTAL KNEE ARTHROPLASTY Right 09/17/2014   Procedure: RIGHT TOTAL KNEE ARTHROPLASTY Steroid injection LEFT KNEE;  Surgeon: [name], MD;  Location: [place];  Service: Orthopedics;  Laterality: Right;  . TOTAL KNEE ARTHROPLASTY Left 03/25/2015   Procedure: TOTAL KNEE ARTHROPLASTY;  Surgeon: [name], MD;  Location: [place];  Service: Orthopedics;  Laterality: Left;       Family History  Problem Relation Age of Onset  . Diabetes Maternal Grandmother   . Heart attack Father   . Hypertension Father   . Heart disease Mother   . Hypertension Mother   . Heart attack Brother     Social History   Tobacco Use  . Smoking status: Former Smoker    Quit date: 10/19/1996    Years since quitting: 23.5  . Smokeless tobacco: Never Used  Vaping Use  . Vaping Use: Never used  Substance Use Topics  . Alcohol use: Yes    Comment: occassional  . Drug use: No    Home Medications Prior to Admission medications   Medication Sig Start Date End Date Taking? Authorizing Provider  celecoxib (CELEBREX) 100 MG capsule Take 1 capsule (100 mg total) by mouth 2 (two) times daily.  04/25/20   Hyatt, Max T, DPM  citalopram (CELEXA) 20 MG tablet Take 20 mg by mouth daily.    [provider]  furosemide (LASIX) 40 MG tablet Take 1 tablet (40 mg total) by mouth 2 (two) times daily. Please call and schedule an appointment for further refills 1st attempt 07/05/18   Josue Hector, MD  hydrochlorothiazide (HYDRODIURIL) 25 MG tablet Take 25 mg by mouth daily. 04/24/20   [provider]  IRON PO Take 1 tablet by mouth daily.    [provider]  methylPREDNISolone (MEDROL DOSEPAK) 4 MG TBPK tablet 6 day dose pack - take as directed 04/25/20   Hyatt, Max T, DPM  Multiple Vitamin (MV-ONE) CAPS Take 1 capsule by mouth daily.    [provider]  olmesartan (BENICAR) 40 MG tablet Take 40 mg by  mouth daily. 09/24/19   [provider]  Omega-3 Fatty Acids (FISH OIL PO) Take 1 tablet by mouth daily.    [provider]  potassium chloride SA (KLOR-CON) 20 MEQ tablet Take 1 tablet (20 mEq total) by mouth daily. 02/12/20   Josue Hector, MD  simvastatin (ZOCOR) 20 MG tablet Take 20 mg by mouth daily. 02/22/16   [provider]  Tamsulosin HCl (FLOMAX) 0.4 MG CAPS Take 0.4 mg by mouth daily.    [provider]  traZODone (DESYREL) 50 MG tablet Take 50 mg by mouth at bedtime as needed. sleep 02/11/15   [provider]  Turmeric 450 MG CAPS Take by mouth.    [provider]  verapamil (CALAN-SR) 120 MG CR tablet Take 120 mg by mouth 3 (three) times daily.     [provider]  XARELTO 20 MG TABS tablet TAKE 1 TABLET BY MOUTH  DAILY WITH SUPPER 08/18/19   Josue Hector, MD    Allergies    Demerol [meperidine], Meloxicam, Keflex [cephalexin], and Penicillins  Review of Systems   Review of Systems  All other systems reviewed and are negative.   Physical Exam Updated Vital Signs BP (!) 158/85 (BP Location: Right Arm)   Pulse 68   Temp 98.2 F (36.8 C) (Oral)   Resp 16   SpO2 94%   Physical Exam Vitals and nursing note reviewed.  Constitutional:      General: He is not in acute distress.    Appearance: Normal appearance. He is obese.  HENT:     Head: Normocephalic.     Left Ear: Tympanic membrane normal. Laceration present.     Ears:      Nose: Nose normal.     Mouth/Throat:     Mouth: Mucous membranes are moist.  Eyes:     Extraocular Movements: Extraocular movements intact.     Conjunctiva/sclera: Conjunctivae normal.  Cardiovascular:     Rate and Rhythm: Normal rate.  Pulmonary:     Effort: Pulmonary effort is normal.  Musculoskeletal:     Right lower leg: No edema.     Left lower leg: No edema.  Skin:    General: Skin is warm and dry.  Neurological:     General: No focal deficit present.     Mental  Status: He is alert. Mental status is at baseline.  Psychiatric:        Mood and Affect: Mood normal.        Behavior: Behavior normal.        Thought Content: Thought content normal.     ED Results / Procedures / Treatments  Labs (all labs ordered are listed, but only abnormal results are displayed) Labs Reviewed - No data to display  EKG None  Radiology No results found.  Procedures Procedures (including critical care time) LACERATION REPAIR Performed by: [employer] Authorized by: Blanchie Dessert Consent: Verbal consent obtained. Risks and benefits: risks, benefits and alternatives were discussed Consent given by: patient Patient identity confirmed: provided demographic data Prepped and Draped in normal sterile fashion Wound explored  Laceration Location: left ear - concha/antihelix area  Laceration Length: 4cm  No Foreign Bodies seen or palpated  Anesthesia: local infiltration  Local anesthetic: lidocaine 2% without epinephrine  Anesthetic total: 4 ml  Irrigation method: syringe Amount of cleaning: standard  Skin closure: 6.0 prolene  Number of sutures: 17 (several separate areas of laceration needed to be brought together to correctly align)  Technique: simple interrupted  Patient tolerance: Patient tolerated the procedure well with no immediate complications.   Medications Ordered in ED Medications  lidocaine (XYLOCAINE) 2 % (with pres) injection 200 mg (200 mg Infiltration Given by Other 05/13/20 1012)    ED Course  I have reviewed the triage vital signs and the nursing notes.  Pertinent labs & imaging results that were available during my care of the patient were reviewed by me and considered in my medical decision making (see chart for details).    MDM Rules/Calculators/A&P                          Patient presenting with a complex ear laceration through the concha, antihelix and posterior portion of the ear.  Wound was repaired as  above.  Minimal cartilage was present and no deformity noted of the ear.  Tetanus shot is up-to-date.  Pressure dressing was applied to avoid hematoma and bleeding had subsided once sutures were placed.  Patient given return precautions will leave the pressure dressing in place for 24 hours and follow-up with his doctor in 7 to 10 days for suture removal. Final Clinical Impression(s) / ED Diagnoses Final diagnoses:  Complex laceration of right ear, initial encounter    Rx / DC Orders ED Discharge Orders    None       Blanchie Dessert, MD 05/13/20 1207

## 2020-05-13 NOTE — ED Triage Notes (Signed)
Pt reports he fell out of bed and hit right ear on nightstand causing a laceration. Pt has bandaged in triage. Madilyn Hook. Denies LOC

## 2020-05-30 ENCOUNTER — Encounter: Payer: Self-pay | Admitting: Podiatry

## 2020-05-30 ENCOUNTER — Ambulatory Visit: Payer: Medicare Other | Admitting: Podiatry

## 2020-05-30 ENCOUNTER — Other Ambulatory Visit: Payer: Self-pay

## 2020-05-30 DIAGNOSIS — M722 Plantar fascial fibromatosis: Secondary | ICD-10-CM | POA: Diagnosis not present

## 2020-05-30 MED ORDER — DICLOFENAC SODIUM 75 MG PO TBEC: 75.0000 mg | DELAYED_RELEASE_TABLET | Freq: Two times a day (BID) | ORAL | 3 refills | Status: DC

## 2020-05-30 NOTE — Progress Notes (Signed)
He presents today for follow-up of his right heel pain.  He states that is really not any better did not notice much met help with the medicine either.  Objective: Vital signs stable alert oriented x3.  Pulses are palpable.  He has pain on palpation lateral aspect of the calcaneal tubercle left.  Assessment: Lateral plantar fasciitis left.  Plan: I reinjected left heel today Kenalog 20 mg and 5 mg Marcaine.  Tolerated procedure well without complications started with different anti-inflammatory Voltaren 75 mg 1 p.o. twice daily on follow-up with him in 1 month.

## 2020-06-02 ENCOUNTER — Other Ambulatory Visit: Payer: Self-pay | Admitting: Cardiovascular Disease

## 2020-06-03 NOTE — Telephone Encounter (Signed)
Last OV 04/10/20 Scr 0.93 on 05/01/20 ABW crcl 133ml/min I spoke with patient about the increased risk of bleeding with diclofenac and Xarelto. Patient states he will stop taking dicolencaf

## 2020-07-11 ENCOUNTER — Ambulatory Visit: Payer: Medicare Other | Admitting: Podiatry

## 2020-07-16 ENCOUNTER — Ambulatory Visit: Payer: Medicare Other | Admitting: Podiatry

## 2020-07-16 ENCOUNTER — Telehealth: Payer: Self-pay | Admitting: *Deleted

## 2020-07-16 ENCOUNTER — Other Ambulatory Visit: Payer: Self-pay

## 2020-07-16 ENCOUNTER — Encounter: Payer: Self-pay | Admitting: Podiatry

## 2020-07-16 DIAGNOSIS — M722 Plantar fascial fibromatosis: Secondary | ICD-10-CM

## 2020-07-16 NOTE — Telephone Encounter (Signed)
    [facility] Pre-operative Risk Assessment    HEARTCARE STAFF: - Please ensure there is not already an duplicate clearance open for this procedure. - Under Visit Info/Reason for Call, type in Other and utilize the format Clearance MM/DD/YY or Clearance TBD. Do not use dashes or single digits. - If request is for dental extraction, please clarify the # of teeth to be extracted.  Request for surgical clearance:  1. What type of surgery is being performed? Left L3, L4, L5 radiofrequency ablation   2. When is this surgery scheduled? TBD   3. What type of clearance is required (medical clearance vs. Pharmacy clearance to hold med vs. Both)? Both  4. Are there any medications that need to be held prior to surgery and how long?Xarelto   5. Practice name and name of physician performing surgery? [facility]; Dr Laroy Apple   6. What is the office phone number? [phone]   7.   What is the office fax number? [phone] Attn X-RAY  8.   Anesthesia type (None, local, MAC, general) ? None listed   Joe [name] 07/16/2020, 4:29 PM  _________________________________________________________________   (provider comments below)

## 2020-07-17 NOTE — Progress Notes (Signed)
He presents today for follow-up of his heel he states that is still sore but is much better than it was relates that is about 60% improved.  Objective: Vital signs are stable he is alert oriented x3 pulses are palpable.  Still has some pain on palpation medial calcaneal tubercle of the left heel but much less than previously noted.  Assessment: Plantar fasciitis.  Plan: Reinjected the heel today 20 mg Kenalog 5 mg Marcaine follow-up with him in 1 month

## 2020-07-17 NOTE — Telephone Encounter (Signed)
Patient with diagnosis of paroxysmal Afib on Xarelto 20 mg for anticoagulation.    Procedure: Lumbar radiofrequency ablation Date of procedure: TBD  CHADS2-VASc score of 4 (CHF, HTN, AGE, AGE)   CrCl 84 mL/min, based on adjusted body weight and Scr 0.93 on 05/01/20.  Per office protocol, patient can hold Xarelto for 3 days prior to procedure.

## 2020-07-17 NOTE — Telephone Encounter (Signed)
   Primary Cardiologist: [facility] Rouge, MD  Chart reviewed as part of pre-operative protocol coverage. Patient was contacted 07/17/2020 in reference to pre-operative risk assessment for pending surgery as outlined below.  Joe [name] was last seen on 04/10/20 by Dr. Johnsie Cancel with history of mild AS, PAF, diastolic CHF, edema, HTN, OSA, RBBB, HLD, depression. Last echo 09/2019 with only mild AS, next f/u planned in 09/2021. I spoke with the patient who affirms he is doing great without any new cardiac symptoms. Therefore, based on ACC/AHA guidelines, the patient would be at acceptable risk for the planned procedure without further cardiovascular testing.   The patient was advised that if he develops new symptoms prior to surgery to contact our office to arrange for a follow-up visit, and he verbalized understanding.  Per office protocol, patient can hold Xarelto for 3 days prior to procedure.   I will route this recommendation to the requesting party via Epic fax function and remove from pre-op pool. Please call with questions.  Charlie Pitter, PA-C 07/17/2020, 11:23 AM

## 2020-08-29 ENCOUNTER — Ambulatory Visit: Payer: Medicare Other | Admitting: Podiatry

## 2020-10-04 ENCOUNTER — Other Ambulatory Visit: Payer: Self-pay | Admitting: Family Medicine

## 2020-10-04 DIAGNOSIS — R9089 Other abnormal findings on diagnostic imaging of central nervous system: Secondary | ICD-10-CM

## 2020-10-10 ENCOUNTER — Ambulatory Visit (HOSPITAL_COMMUNITY): Payer: Medicare Other | Attending: Internal Medicine

## 2020-10-10 ENCOUNTER — Other Ambulatory Visit: Payer: Self-pay

## 2020-10-10 DIAGNOSIS — I350 Nonrheumatic aortic (valve) stenosis: Secondary | ICD-10-CM

## 2020-10-10 LAB — ECHOCARDIOGRAM COMPLETE
AR max vel: 1.95 cm2
AV Area VTI: 2.23 cm2
AV Area mean vel: 2.01 cm2
AV Mean grad: 8 mmHg
AV Peak grad: 14 mmHg
Ao pk vel: 1.87 m/s
Area-P 1/2: 2.74 cm2
S' Lateral: 3.8 cm

## 2020-10-28 ENCOUNTER — Other Ambulatory Visit: Payer: Self-pay

## 2020-10-28 ENCOUNTER — Ambulatory Visit
Admission: RE | Admit: 2020-10-28 | Discharge: 2020-10-28 | Disposition: A | Discharge status: Home / Self Care | Payer: Medicare Other | Source: Ambulatory Visit | Attending: Family Medicine | Admitting: Family Medicine

## 2020-10-28 DIAGNOSIS — J0100 Acute maxillary sinusitis, unspecified: Secondary | ICD-10-CM | POA: Diagnosis not present

## 2020-10-28 DIAGNOSIS — I672 Cerebral atherosclerosis: Secondary | ICD-10-CM | POA: Diagnosis not present

## 2020-10-28 DIAGNOSIS — G9389 Other specified disorders of brain: Secondary | ICD-10-CM | POA: Diagnosis not present

## 2020-10-28 DIAGNOSIS — R9089 Other abnormal findings on diagnostic imaging of central nervous system: Secondary | ICD-10-CM

## 2020-10-28 DIAGNOSIS — J320 Chronic maxillary sinusitis: Secondary | ICD-10-CM | POA: Diagnosis not present

## 2020-10-28 MED ORDER — FUROSEMIDE 40 MG PO TABS: 40.0000 mg | ORAL_TABLET | Freq: Two times a day (BID) | ORAL | 1 refills | Status: DC

## 2020-10-28 MED ORDER — IOPAMIDOL (ISOVUE-370) INJECTION 76%
75.0000 mL | Freq: Once | INTRAVENOUS | Status: AC | PRN
  Administered 2020-10-28: 75 mL via INTRAVENOUS

## 2020-11-05 DIAGNOSIS — M545 Low back pain, unspecified: Secondary | ICD-10-CM | POA: Diagnosis not present

## 2020-11-05 DIAGNOSIS — G4733 Obstructive sleep apnea (adult) (pediatric): Secondary | ICD-10-CM | POA: Diagnosis not present

## 2020-11-05 DIAGNOSIS — M47816 Spondylosis without myelopathy or radiculopathy, lumbar region: Secondary | ICD-10-CM | POA: Diagnosis not present

## 2020-11-08 ENCOUNTER — Other Ambulatory Visit: Payer: Self-pay | Admitting: Physical Medicine and Rehabilitation

## 2020-11-08 DIAGNOSIS — M545 Low back pain, unspecified: Secondary | ICD-10-CM

## 2020-11-08 DIAGNOSIS — G8929 Other chronic pain: Secondary | ICD-10-CM

## 2020-11-13 DIAGNOSIS — I270 Primary pulmonary hypertension: Secondary | ICD-10-CM | POA: Diagnosis not present

## 2020-11-13 DIAGNOSIS — R7301 Impaired fasting glucose: Secondary | ICD-10-CM | POA: Diagnosis not present

## 2020-11-13 DIAGNOSIS — I10 Essential (primary) hypertension: Secondary | ICD-10-CM | POA: Diagnosis not present

## 2020-11-13 DIAGNOSIS — R809 Proteinuria, unspecified: Secondary | ICD-10-CM | POA: Diagnosis not present

## 2020-11-13 DIAGNOSIS — D692 Other nonthrombocytopenic purpura: Secondary | ICD-10-CM | POA: Diagnosis not present

## 2020-11-13 DIAGNOSIS — E782 Mixed hyperlipidemia: Secondary | ICD-10-CM | POA: Diagnosis not present

## 2020-11-13 DIAGNOSIS — I700 Atherosclerosis of aorta: Secondary | ICD-10-CM | POA: Diagnosis not present

## 2020-11-13 DIAGNOSIS — I4891 Unspecified atrial fibrillation: Secondary | ICD-10-CM | POA: Diagnosis not present

## 2020-11-13 DIAGNOSIS — G4733 Obstructive sleep apnea (adult) (pediatric): Secondary | ICD-10-CM | POA: Diagnosis not present

## 2020-11-13 DIAGNOSIS — Z0000 Encounter for general adult medical examination without abnormal findings: Secondary | ICD-10-CM | POA: Diagnosis not present

## 2020-11-22 DIAGNOSIS — H43811 Vitreous degeneration, right eye: Secondary | ICD-10-CM | POA: Diagnosis not present

## 2020-11-22 DIAGNOSIS — H35031 Hypertensive retinopathy, right eye: Secondary | ICD-10-CM | POA: Diagnosis not present

## 2020-11-22 DIAGNOSIS — Z961 Presence of intraocular lens: Secondary | ICD-10-CM | POA: Diagnosis not present

## 2020-11-22 DIAGNOSIS — H35371 Puckering of macula, right eye: Secondary | ICD-10-CM | POA: Diagnosis not present

## 2020-12-07 ENCOUNTER — Ambulatory Visit
Admission: RE | Admit: 2020-12-07 | Discharge: 2020-12-07 | Disposition: A | Discharge status: Home / Self Care | Payer: Medicare Other | Source: Ambulatory Visit | Attending: Physical Medicine and Rehabilitation | Admitting: Physical Medicine and Rehabilitation

## 2020-12-07 ENCOUNTER — Other Ambulatory Visit: Payer: Self-pay

## 2020-12-07 DIAGNOSIS — M545 Low back pain, unspecified: Secondary | ICD-10-CM

## 2020-12-07 DIAGNOSIS — G8929 Other chronic pain: Secondary | ICD-10-CM

## 2020-12-17 DIAGNOSIS — M545 Low back pain, unspecified: Secondary | ICD-10-CM | POA: Diagnosis not present

## 2020-12-20 ENCOUNTER — Other Ambulatory Visit: Payer: Self-pay | Admitting: Cardiovascular Disease

## 2020-12-20 DIAGNOSIS — M5416 Radiculopathy, lumbar region: Secondary | ICD-10-CM | POA: Diagnosis not present

## 2020-12-20 MED ORDER — POTASSIUM CHLORIDE CRYS ER 20 MEQ PO TBCR: 20.0000 meq | EXTENDED_RELEASE_TABLET | Freq: Every day | ORAL | 0 refills | Status: DC

## 2020-12-20 NOTE — Telephone Encounter (Signed)
Prescription refill request for Xarelto received.   Indication: Afib Last office visit: Nishan, 04/10/2020 Weight: 125.2 kg  Age: 79 yo  Scr: 0.93, 05/01/2020 CrCl: 115 ml/min   Pt is on the correct dose of Xarelto per dosing criteria. Prescription refill sent for Xarelto 20mg  daily.

## 2021-01-16 DIAGNOSIS — M545 Low back pain, unspecified: Secondary | ICD-10-CM | POA: Diagnosis not present

## 2021-01-16 DIAGNOSIS — M5416 Radiculopathy, lumbar region: Secondary | ICD-10-CM | POA: Diagnosis not present

## 2021-01-23 DIAGNOSIS — M5416 Radiculopathy, lumbar region: Secondary | ICD-10-CM | POA: Diagnosis not present

## 2021-02-03 DIAGNOSIS — G4733 Obstructive sleep apnea (adult) (pediatric): Secondary | ICD-10-CM | POA: Diagnosis not present

## 2021-02-20 DIAGNOSIS — M47816 Spondylosis without myelopathy or radiculopathy, lumbar region: Secondary | ICD-10-CM | POA: Diagnosis not present

## 2021-03-03 ENCOUNTER — Other Ambulatory Visit: Payer: Self-pay | Admitting: Cardiovascular Disease

## 2021-03-17 ENCOUNTER — Other Ambulatory Visit: Payer: Self-pay | Admitting: Cardiovascular Disease

## 2021-05-05 DIAGNOSIS — G4733 Obstructive sleep apnea (adult) (pediatric): Secondary | ICD-10-CM | POA: Diagnosis not present

## 2021-05-15 DIAGNOSIS — M47816 Spondylosis without myelopathy or radiculopathy, lumbar region: Secondary | ICD-10-CM | POA: Diagnosis not present

## 2021-06-02 ENCOUNTER — Other Ambulatory Visit: Payer: Self-pay | Admitting: Cardiovascular Disease

## 2021-06-02 NOTE — Telephone Encounter (Signed)
Prescription refill request for Xarelto received.  Indication: Afib Last office visit: 04/10/20  Joe [name])  (1 year follow up appt scheduled for 11/04/21)  Weight: 125.2kg  Age: 80 Scr: 0.83 (11/13/20) CrCl: 129.89 ml/min   Appropriate dose and refill sent to requested pharmacy.

## 2021-06-08 ENCOUNTER — Other Ambulatory Visit: Payer: Self-pay | Admitting: Cardiovascular Disease

## 2021-06-10 DIAGNOSIS — H524 Presbyopia: Secondary | ICD-10-CM | POA: Diagnosis not present

## 2021-06-10 DIAGNOSIS — H35371 Puckering of macula, right eye: Secondary | ICD-10-CM | POA: Diagnosis not present

## 2021-06-10 DIAGNOSIS — H35031 Hypertensive retinopathy, right eye: Secondary | ICD-10-CM | POA: Diagnosis not present

## 2021-06-10 DIAGNOSIS — H43811 Vitreous degeneration, right eye: Secondary | ICD-10-CM | POA: Diagnosis not present

## 2021-06-10 DIAGNOSIS — Z961 Presence of intraocular lens: Secondary | ICD-10-CM | POA: Diagnosis not present

## 2021-06-13 DIAGNOSIS — R7301 Impaired fasting glucose: Secondary | ICD-10-CM | POA: Diagnosis not present

## 2021-06-13 DIAGNOSIS — N183 Chronic kidney disease, stage 3 unspecified: Secondary | ICD-10-CM | POA: Diagnosis not present

## 2021-06-13 DIAGNOSIS — I10 Essential (primary) hypertension: Secondary | ICD-10-CM | POA: Diagnosis not present

## 2021-06-13 DIAGNOSIS — C44629 Squamous cell carcinoma of skin of left upper limb, including shoulder: Secondary | ICD-10-CM | POA: Diagnosis not present

## 2021-06-13 DIAGNOSIS — E782 Mixed hyperlipidemia: Secondary | ICD-10-CM | POA: Diagnosis not present

## 2021-06-13 DIAGNOSIS — L989 Disorder of the skin and subcutaneous tissue, unspecified: Secondary | ICD-10-CM | POA: Diagnosis not present

## 2021-06-24 DIAGNOSIS — M47816 Spondylosis without myelopathy or radiculopathy, lumbar region: Secondary | ICD-10-CM | POA: Diagnosis not present

## 2021-08-05 DIAGNOSIS — G4733 Obstructive sleep apnea (adult) (pediatric): Secondary | ICD-10-CM | POA: Diagnosis not present

## 2021-09-23 DIAGNOSIS — Z789 Other specified health status: Secondary | ICD-10-CM | POA: Diagnosis not present

## 2021-10-09 DIAGNOSIS — C44629 Squamous cell carcinoma of skin of left upper limb, including shoulder: Secondary | ICD-10-CM | POA: Diagnosis not present

## 2021-10-09 DIAGNOSIS — D485 Neoplasm of uncertain behavior of skin: Secondary | ICD-10-CM | POA: Diagnosis not present

## 2021-10-21 NOTE — Progress Notes (Signed)
Cardiology Office Note   Date:  11/04/2021   ID:  Joe [name], DOB [birth year], MRN [id number]  PCP:  Joe Neer, MD  Cardiologist:  Dr. Johnsie [name]    No chief complaint on file.     History of Present Illness: Joe [name] is a 81 y.o. male who presents for follow up for AS,  PAF, diastolic CHF and edema Also history of HTN, OSA RBBB  July 2018 Had edema TTE 08/18/18 with mild AS normal EF and grade 2 diastolic dysfunction  He saw [place]. [place], [place] 04/28/17 with c/o shortness of breath and LLE, reports of irregular HB. 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   ROTATOR CUFF REPAIR     TOTAL KNEE ARTHROPLASTY Right 09/17/2014   Procedure: RIGHT TOTAL KNEE ARTHROPLASTY Steroid injection LEFT KNEE;  Surgeon: [name], MD;  Location: [facility];  Service: Orthopedics;  Laterality: Right;   TOTAL KNEE ARTHROPLASTY Left 03/25/2015   Procedure: TOTAL KNEE ARTHROPLASTY;  Surgeon: [name], MD;  Location: [facility];  Service: Orthopedics;  Laterality: Left;     Current Outpatient Medications  Medication Sig Dispense Refill   chlorthalidone (HYGROTON) 50 MG tablet TAKE 1 TABLET BY MOUTH  EVERY MORNING WITH FOOD     citalopram (CELEXA) 20 MG tablet Take 20 mg by mouth daily.     furosemide (LASIX) 40 MG tablet Take 1 tablet (40 mg total) by mouth 2 (two) times daily. 180 tablet 1   hydrochlorothiazide (HYDRODIURIL) 25 MG tablet Take 25 mg by mouth daily.     IRON PO Take 1 tablet by mouth daily.     Multiple Vitamin (MV-ONE) CAPS Take 1 capsule by mouth daily.     olmesartan (BENICAR) 40 MG tablet Take 40 mg by mouth daily.     Omega-3 Fatty Acids (FISH OIL PO) Take 1 tablet by mouth daily.     potassium chloride SA (KLOR-CON) 20 MEQ tablet Take 1 tablet (20 mEq total) by mouth daily. Pt needs to keep upcoming appt in Jan, 2023 for further refills 90 tablet 1   simvastatin (ZOCOR) 20 MG tablet Take 20 mg by mouth daily.     Tamsulosin HCl (FLOMAX) 0.4 MG CAPS Take 0.4 mg by mouth daily.     traZODone (DESYREL) 50 MG tablet Take 50 mg by mouth at bedtime as needed. sleep  3   Turmeric 450 MG CAPS Take by mouth.     verapamil (CALAN-SR) 120 MG CR tablet Take 120 mg by mouth 3 (three) times  daily.      XARELTO 20 MG TABS tablet TAKE 1 TABLET BY MOUTH  DAILY WITH SUPPER 90 tablet 3   No current facility-administered medications for this visit.    Allergies:   Demerol [meperidine], Meloxicam, Keflex [cephalexin], and Penicillins    Social History:  The patient  reports that he quit smoking about 25 years ago. His smoking use included cigarettes. He has never used smokeless tobacco. He reports current alcohol use. He reports that he does not use drugs.   Family History:  The patient's family history includes Diabetes in his maternal grandmother; Heart attack in his brother and father; Heart disease in his mother; Hypertension in his father and mother.    ROS:  General:no colds or fevers, no weight changes Skin:no rashes or ulcers HEENT:no blurred vision, no congestion CV:see HPI PUL:see HPI GI:no diarrhea constipation or melena, no indigestion GU:no hematuria, no  dysuria MS:no joint pain, no claudication Neuro:no syncope, no lightheadedness Endo:no diabetes, no thyroid disease  Wt Readings from Last 3 Encounters:  11/04/21 275 lb 9.6 oz (125 kg)  04/10/20 276 lb (125.2 kg)  09/25/19 276 lb (125.2 kg)     PHYSICAL EXAM: VS:  BP (!) 148/78    Pulse 61    Ht 5\' 6"  (1.676 m)    Wt 275 lb 9.6 oz (125 kg)    SpO2 94%    BMI 44.48 kg/m  , BMI Body mass index is 44.48 kg/m.   Affect appropriate Obese male  HEENT: normal Neck supple with no adenopathy JVP normal no bruits no thyromegaly Lungs clear with no wheezing and good diaphragmatic motion Heart:  S1/S2 AS murmur, no rub, gallop or click PMI normal Abdomen: benighn, BS positve, no tenderness, no AAA no bruit.  No HSM or HJR Distal pulses intact with no bruits Plus one bilateral  Edema worse on left  Neuro non-focal Skin warm and dry No muscular weakness  EKG:   05/19/17 SR RBBB rate 81 PVC;s 09/29/18 SR rate 65  ICRBBB  11/04/2021 NSR rate 61 PVC isolated otherwise normal   Recent Labs: No results found for requested labs within last 8760 hours.    Lipid Panel No results found for: CHOL, TRIG, HDL, CHOLHDL, VLDL, LDLCALC, LDLDIRECT     Other studies Reviewed: Additional studies/ records that were reviewed today include: . ECHO  10/10/20   IMPRESSIONS     1. Left ventricular ejection fraction, by estimation, is 55 to 60%. The  left ventricle has normal function. The left ventricle has no regional  wall motion abnormalities. There is moderate left ventricular hypertrophy.  Left ventricular diastolic  parameters are consistent with Grade I diastolic dysfunction (impaired  relaxation).   2. Right ventricular systolic function is normal. The right ventricular  size is normal. There is normal pulmonary artery systolic pressure.   3. The mitral valve is grossly normal. No evidence of mitral valve  regurgitation.   4. The aortic valve is tricuspid. Aortic valve regurgitation is  not  visualized. Mild to moderate aortic valve sclerosis/calcification is  present, without any evidence of aortic stenosis. Aortic valve mean  gradient measures 8.0 mmHg.   5. The inferior vena cava is normal in size with greater than 50%  respiratory variability, suggesting right atrial pressure of 3 mmHg.   Comparison(s): Changes from prior study are noted. 10/05/2019: LVEF  65-70%, mild aortic stenosis (mean gradient 14 mmHg).     GXT 09/09/15 Study Highlights Blood pressure demonstrated a hypertensive response  to exercise. There was no ST segment deviation noted during stress.        ASSESSMENT AND PLAN:  1.  Acute on chronic diastolic HF and in combination with a fib.  Improved continue diuretics   2.  PAF- maintaining SR.  On xarelto and verapamil    3. HTN:  Well controlled.  Continue current medications and low sodium Dash type diet.    4. AS:  10/10/20 EF 55-60% mild AS mean gradient 8 peak 14 mmHg DVI 0.71 AVA 1.95 cm2 consider f/u echo in a year or change in murmur / new symptoms   F/U in a year   [employer]

## 2021-11-04 ENCOUNTER — Ambulatory Visit: Payer: Medicare Other | Admitting: Cardiovascular Disease

## 2021-11-04 ENCOUNTER — Other Ambulatory Visit: Payer: Self-pay

## 2021-11-04 ENCOUNTER — Encounter: Payer: Self-pay | Admitting: Cardiovascular Disease

## 2021-11-04 VITALS — BP 148/78 | HR 61 | Ht 66.0 in | Wt 275.6 lb

## 2021-11-04 DIAGNOSIS — I350 Nonrheumatic aortic (valve) stenosis: Secondary | ICD-10-CM

## 2021-11-04 DIAGNOSIS — I10 Essential (primary) hypertension: Secondary | ICD-10-CM | POA: Diagnosis not present

## 2021-11-04 DIAGNOSIS — I480 Paroxysmal atrial fibrillation: Secondary | ICD-10-CM

## 2021-11-04 DIAGNOSIS — G4733 Obstructive sleep apnea (adult) (pediatric): Secondary | ICD-10-CM | POA: Diagnosis not present

## 2021-11-04 NOTE — Patient Instructions (Signed)

## 2021-11-15 ENCOUNTER — Other Ambulatory Visit: Payer: Self-pay | Admitting: Cardiovascular Disease

## 2021-11-18 DIAGNOSIS — I700 Atherosclerosis of aorta: Secondary | ICD-10-CM | POA: Diagnosis not present

## 2021-11-18 DIAGNOSIS — G4733 Obstructive sleep apnea (adult) (pediatric): Secondary | ICD-10-CM | POA: Diagnosis not present

## 2021-11-18 DIAGNOSIS — N183 Chronic kidney disease, stage 3 unspecified: Secondary | ICD-10-CM | POA: Diagnosis not present

## 2021-11-18 DIAGNOSIS — R7301 Impaired fasting glucose: Secondary | ICD-10-CM | POA: Diagnosis not present

## 2021-11-18 DIAGNOSIS — I10 Essential (primary) hypertension: Secondary | ICD-10-CM | POA: Diagnosis not present

## 2021-11-18 DIAGNOSIS — D692 Other nonthrombocytopenic purpura: Secondary | ICD-10-CM | POA: Diagnosis not present

## 2021-11-18 DIAGNOSIS — I270 Primary pulmonary hypertension: Secondary | ICD-10-CM | POA: Diagnosis not present

## 2021-11-18 DIAGNOSIS — Z970 Presence of artificial eye: Secondary | ICD-10-CM | POA: Diagnosis not present

## 2021-11-18 DIAGNOSIS — Z0000 Encounter for general adult medical examination without abnormal findings: Secondary | ICD-10-CM | POA: Diagnosis not present

## 2021-11-18 DIAGNOSIS — I4891 Unspecified atrial fibrillation: Secondary | ICD-10-CM | POA: Diagnosis not present

## 2021-11-18 DIAGNOSIS — E782 Mixed hyperlipidemia: Secondary | ICD-10-CM | POA: Diagnosis not present

## 2021-12-08 DIAGNOSIS — H04121 Dry eye syndrome of right lacrimal gland: Secondary | ICD-10-CM | POA: Diagnosis not present

## 2021-12-08 DIAGNOSIS — H35371 Puckering of macula, right eye: Secondary | ICD-10-CM | POA: Diagnosis not present

## 2021-12-08 DIAGNOSIS — D3141 Benign neoplasm of right ciliary body: Secondary | ICD-10-CM | POA: Diagnosis not present

## 2021-12-08 DIAGNOSIS — Z961 Presence of intraocular lens: Secondary | ICD-10-CM | POA: Diagnosis not present

## 2021-12-08 DIAGNOSIS — H524 Presbyopia: Secondary | ICD-10-CM | POA: Diagnosis not present

## 2021-12-12 DIAGNOSIS — D0462 Carcinoma in situ of skin of left upper limb, including shoulder: Secondary | ICD-10-CM | POA: Diagnosis not present

## 2021-12-12 DIAGNOSIS — L905 Scar conditions and fibrosis of skin: Secondary | ICD-10-CM | POA: Diagnosis not present

## 2022-02-03 DIAGNOSIS — G4733 Obstructive sleep apnea (adult) (pediatric): Secondary | ICD-10-CM | POA: Diagnosis not present

## 2022-04-06 ENCOUNTER — Other Ambulatory Visit: Payer: Self-pay | Admitting: Cardiovascular Disease

## 2022-04-06 NOTE — Telephone Encounter (Signed)
Prescription refill request for Xarelto received.  Indication:Afib Last office visit:1/23 Weight:125 kg Age:81 Scr:0.9 CrCl:117.67 ml/min  Prescription refilled

## 2022-05-07 DIAGNOSIS — M47816 Spondylosis without myelopathy or radiculopathy, lumbar region: Secondary | ICD-10-CM | POA: Diagnosis not present

## 2022-05-18 DIAGNOSIS — I700 Atherosclerosis of aorta: Secondary | ICD-10-CM | POA: Diagnosis not present

## 2022-05-18 DIAGNOSIS — I4891 Unspecified atrial fibrillation: Secondary | ICD-10-CM | POA: Diagnosis not present

## 2022-05-18 DIAGNOSIS — I129 Hypertensive chronic kidney disease with stage 1 through stage 4 chronic kidney disease, or unspecified chronic kidney disease: Secondary | ICD-10-CM | POA: Diagnosis not present

## 2022-05-18 DIAGNOSIS — N183 Chronic kidney disease, stage 3 unspecified: Secondary | ICD-10-CM | POA: Diagnosis not present

## 2022-05-18 DIAGNOSIS — D6869 Other thrombophilia: Secondary | ICD-10-CM | POA: Diagnosis not present

## 2022-05-18 DIAGNOSIS — E782 Mixed hyperlipidemia: Secondary | ICD-10-CM | POA: Diagnosis not present

## 2022-05-18 DIAGNOSIS — R7301 Impaired fasting glucose: Secondary | ICD-10-CM | POA: Diagnosis not present

## 2022-07-02 DIAGNOSIS — M47816 Spondylosis without myelopathy or radiculopathy, lumbar region: Secondary | ICD-10-CM | POA: Diagnosis not present

## 2022-07-07 DIAGNOSIS — M5459 Other low back pain: Secondary | ICD-10-CM | POA: Diagnosis not present

## 2022-07-07 DIAGNOSIS — M6281 Muscle weakness (generalized): Secondary | ICD-10-CM | POA: Diagnosis not present

## 2022-07-09 DIAGNOSIS — G4733 Obstructive sleep apnea (adult) (pediatric): Secondary | ICD-10-CM | POA: Diagnosis not present

## 2022-07-09 DIAGNOSIS — M6281 Muscle weakness (generalized): Secondary | ICD-10-CM | POA: Diagnosis not present

## 2022-07-09 DIAGNOSIS — M5459 Other low back pain: Secondary | ICD-10-CM | POA: Diagnosis not present

## 2022-07-14 DIAGNOSIS — M5459 Other low back pain: Secondary | ICD-10-CM | POA: Diagnosis not present

## 2022-07-14 DIAGNOSIS — M6281 Muscle weakness (generalized): Secondary | ICD-10-CM | POA: Diagnosis not present

## 2022-07-16 ENCOUNTER — Telehealth: Payer: Self-pay | Admitting: Cardiovascular Disease

## 2022-07-16 DIAGNOSIS — R002 Palpitations: Secondary | ICD-10-CM | POA: Diagnosis not present

## 2022-07-16 DIAGNOSIS — M6281 Muscle weakness (generalized): Secondary | ICD-10-CM | POA: Diagnosis not present

## 2022-07-16 DIAGNOSIS — I10 Essential (primary) hypertension: Secondary | ICD-10-CM | POA: Diagnosis not present

## 2022-07-16 DIAGNOSIS — M5459 Other low back pain: Secondary | ICD-10-CM | POA: Diagnosis not present

## 2022-07-16 NOTE — Telephone Encounter (Signed)
Spoke with patient who reports new onset of palpitations over last 2 days, intermittently every 2-4 hours. He denies CP, SOB. History of afib. Patient reports he is taking his medications as directed including Xarelto. BP today is 124/72.  Patient is scheduled to be seen 07/17/22.  Advised him to go to the ED or call 911 if he starts to experience chest pain, SOB or N/V.  Patient verbalized understanding.

## 2022-07-16 NOTE — Telephone Encounter (Signed)
Patient c/o Palpitations:  High priority if patient c/o lightheadedness, shortness of breath, or chest pain  How long have you had palpitations/irregular HR/ Afib? Are you having the symptoms now? 2day and yes  Are you currently experiencing lightheadedness, SOB or CP? no  Do you have a history of afib (atrial fibrillation) or irregular heart rhythm? yes  Have you checked your BP or HR? (document readings if available): 124/82  Are you experiencing any other symptoms?  no

## 2022-07-17 ENCOUNTER — Encounter: Payer: Self-pay | Admitting: Cardiology

## 2022-07-17 ENCOUNTER — Ambulatory Visit: Payer: Medicare Other | Attending: Cardiology | Admitting: Cardiology

## 2022-07-17 ENCOUNTER — Ambulatory Visit (INDEPENDENT_AMBULATORY_CARE_PROVIDER_SITE_OTHER): Payer: Medicare Other

## 2022-07-17 VITALS — BP 150/60 | HR 69 | Ht 66.0 in | Wt 282.0 lb

## 2022-07-17 DIAGNOSIS — I480 Paroxysmal atrial fibrillation: Secondary | ICD-10-CM

## 2022-07-17 DIAGNOSIS — I350 Nonrheumatic aortic (valve) stenosis: Secondary | ICD-10-CM

## 2022-07-17 DIAGNOSIS — I4510 Unspecified right bundle-branch block: Secondary | ICD-10-CM | POA: Diagnosis not present

## 2022-07-17 NOTE — Progress Notes (Unsigned)
ZIO XT monitor mailed to pt's home address.

## 2022-07-17 NOTE — Patient Instructions (Signed)
Medication Instructions:  The current medical regimen is effective;  continue present plan and medications.  *If you need a refill on your cardiac medications before your next appointment, please call your pharmacy*  Testing/Procedures: [place] Monitor Instructions  Your physician has requested you wear a ZIO patch monitor for 14 days.  This is a single patch monitor. [employer] supplies one patch monitor per enrollment. Additional stickers are not available. Please do not apply patch if you will be having a Nuclear Stress Test,  Echocardiogram, Cardiac CT, MRI, or Chest Xray during the period you would be wearing the  monitor. The patch cannot be worn during these tests. You cannot remove and re-apply the  ZIO XT patch monitor.  Your ZIO patch monitor will be mailed 3 day USPS to your address on file. It may take 3-5 days  to receive your monitor after you have been enrolled.  Once you have received your monitor, please review the enclosed instructions. Your monitor  has already been registered assigning a specific monitor serial # to you.  Billing and Patient Assistance Program Information  We have supplied [employer] with any of your insurance information on file for billing purposes. [employer] offers a sliding scale Patient Assistance Program for patients that do not have  insurance, or whose insurance does not completely cover the cost of the ZIO monitor.  You must apply for the Patient Assistance Program to qualify for this discounted rate.  To apply, please call [employer] at [phone], select option 4, select option 2, ask to apply for  Patient Assistance Program. Theodore Demark will ask your household income, and how many people  are in your household. They will quote your out-of-pocket cost based on that information.  [employer] will also be able to set up a 24-month interest-free payment plan if needed.  Applying the monitor   Shave hair from upper left chest.  Hold abrader disc  by orange tab. Rub abrader in 40 strokes over the upper left chest as  indicated in your monitor instructions.  Clean area with 4 enclosed alcohol pads. Let dry.  Apply patch as indicated in monitor instructions. Patch will be placed under collarbone on left  side of chest with arrow pointing upward.  Rub patch adhesive wings for 2 minutes. Remove white label marked "1". Remove the white  label marked "2". Rub patch adhesive wings for 2 additional minutes.  While looking in a mirror, press and release button in center of patch. A small green light will  flash 3-4 times. This will be your only indicator that the monitor has been turned on.  Do not shower for the first 24 hours. You may shower after the first 24 hours.  Press the button if you feel a symptom. You will hear a small click. Record Date, Time and  Symptom in the Patient Logbook.  When you are ready to remove the patch, follow instructions on the last 2 pages of Patient  Logbook. Stick patch monitor onto the last page of Patient Logbook.  Place Patient Logbook in the blue and white box. Use locking tab on box and tape box closed  securely. The blue and white box has prepaid postage on it. Please place it in the mailbox as  soon as possible. Your physician should have your test results approximately 7 days after the  monitor has been mailed back to [facility]  Call [employer] [phone] you have questions regarding  your ZIO XT patch  monitor. Call them immediately if you see an orange light blinking on your  monitor.  If your monitor falls off in less than 4 days, contact our Monitor department at [phone].  If your monitor becomes loose or falls off after 4 days call [employer] at [phone] for  suggestions on securing your monitor    Follow-Up: At [facility], you and your health needs are our priority.  As part of our continuing mission to provide you with exceptional heart care,  we have created designated Provider Care Teams.  These Care Teams include your primary Cardiologist (physician) and Advanced Practice Providers (APPs -  Physician Assistants and Nurse Practitioners) who all work together to provide you with the care you need, when you need it.  We recommend signing up for the patient portal called "MyChart".  Sign up information is provided on this After Visit Summary.  MyChart is used to connect with patients for Virtual Visits (Telemedicine).  Patients are able to view lab/test results, encounter notes, upcoming appointments, etc.  Non-urgent messages can be sent to your provider as well.   To learn more about what you can do with MyChart, go to [email or web address].    Your next appointment:   Will be determined after the above testing.  The format for your next appointment:   In Person  Provider:   Dr Johnsie Cancel        Important Information About [employer]
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
## 2022-07-21 DIAGNOSIS — I480 Paroxysmal atrial fibrillation: Secondary | ICD-10-CM

## 2022-07-21 DIAGNOSIS — M47816 Spondylosis without myelopathy or radiculopathy, lumbar region: Secondary | ICD-10-CM | POA: Diagnosis not present

## 2022-07-21 DIAGNOSIS — M5459 Other low back pain: Secondary | ICD-10-CM | POA: Diagnosis not present

## 2022-07-21 DIAGNOSIS — M6281 Muscle weakness (generalized): Secondary | ICD-10-CM | POA: Diagnosis not present

## 2022-07-23 DIAGNOSIS — M5459 Other low back pain: Secondary | ICD-10-CM | POA: Diagnosis not present

## 2022-07-23 DIAGNOSIS — M47816 Spondylosis without myelopathy or radiculopathy, lumbar region: Secondary | ICD-10-CM | POA: Diagnosis not present

## 2022-07-23 DIAGNOSIS — M6281 Muscle weakness (generalized): Secondary | ICD-10-CM | POA: Diagnosis not present

## 2022-07-28 DIAGNOSIS — M5459 Other low back pain: Secondary | ICD-10-CM | POA: Diagnosis not present

## 2022-07-28 DIAGNOSIS — M47816 Spondylosis without myelopathy or radiculopathy, lumbar region: Secondary | ICD-10-CM | POA: Diagnosis not present

## 2022-07-28 DIAGNOSIS — M6281 Muscle weakness (generalized): Secondary | ICD-10-CM | POA: Diagnosis not present

## 2022-07-30 DIAGNOSIS — M5459 Other low back pain: Secondary | ICD-10-CM | POA: Diagnosis not present

## 2022-07-30 DIAGNOSIS — M6281 Muscle weakness (generalized): Secondary | ICD-10-CM | POA: Diagnosis not present

## 2022-07-30 DIAGNOSIS — M47816 Spondylosis without myelopathy or radiculopathy, lumbar region: Secondary | ICD-10-CM | POA: Diagnosis not present

## 2022-08-04 DIAGNOSIS — M6281 Muscle weakness (generalized): Secondary | ICD-10-CM | POA: Diagnosis not present

## 2022-08-04 DIAGNOSIS — M47816 Spondylosis without myelopathy or radiculopathy, lumbar region: Secondary | ICD-10-CM | POA: Diagnosis not present

## 2022-08-04 DIAGNOSIS — M5459 Other low back pain: Secondary | ICD-10-CM | POA: Diagnosis not present

## 2022-08-06 DIAGNOSIS — M47816 Spondylosis without myelopathy or radiculopathy, lumbar region: Secondary | ICD-10-CM | POA: Diagnosis not present

## 2022-08-06 DIAGNOSIS — M6281 Muscle weakness (generalized): Secondary | ICD-10-CM | POA: Diagnosis not present

## 2022-08-06 DIAGNOSIS — M5459 Other low back pain: Secondary | ICD-10-CM | POA: Diagnosis not present

## 2022-08-11 DIAGNOSIS — M5459 Other low back pain: Secondary | ICD-10-CM | POA: Diagnosis not present

## 2022-08-11 DIAGNOSIS — M47816 Spondylosis without myelopathy or radiculopathy, lumbar region: Secondary | ICD-10-CM | POA: Diagnosis not present

## 2022-08-11 DIAGNOSIS — M6281 Muscle weakness (generalized): Secondary | ICD-10-CM | POA: Diagnosis not present

## 2022-08-13 DIAGNOSIS — M5459 Other low back pain: Secondary | ICD-10-CM | POA: Diagnosis not present

## 2022-08-13 DIAGNOSIS — M6281 Muscle weakness (generalized): Secondary | ICD-10-CM | POA: Diagnosis not present

## 2022-08-13 DIAGNOSIS — M47816 Spondylosis without myelopathy or radiculopathy, lumbar region: Secondary | ICD-10-CM | POA: Diagnosis not present

## 2022-08-13 DIAGNOSIS — I480 Paroxysmal atrial fibrillation: Secondary | ICD-10-CM | POA: Diagnosis not present

## 2022-08-18 DIAGNOSIS — M5459 Other low back pain: Secondary | ICD-10-CM | POA: Diagnosis not present

## 2022-08-18 DIAGNOSIS — M6281 Muscle weakness (generalized): Secondary | ICD-10-CM | POA: Diagnosis not present

## 2022-08-18 DIAGNOSIS — M47816 Spondylosis without myelopathy or radiculopathy, lumbar region: Secondary | ICD-10-CM | POA: Diagnosis not present

## 2022-08-20 DIAGNOSIS — M6281 Muscle weakness (generalized): Secondary | ICD-10-CM | POA: Diagnosis not present

## 2022-08-20 DIAGNOSIS — M5459 Other low back pain: Secondary | ICD-10-CM | POA: Diagnosis not present

## 2022-08-20 DIAGNOSIS — M47816 Spondylosis without myelopathy or radiculopathy, lumbar region: Secondary | ICD-10-CM | POA: Diagnosis not present

## 2022-08-25 DIAGNOSIS — M6281 Muscle weakness (generalized): Secondary | ICD-10-CM | POA: Diagnosis not present

## 2022-08-25 DIAGNOSIS — M5459 Other low back pain: Secondary | ICD-10-CM | POA: Diagnosis not present

## 2022-08-25 DIAGNOSIS — M47816 Spondylosis without myelopathy or radiculopathy, lumbar region: Secondary | ICD-10-CM | POA: Diagnosis not present

## 2022-08-26 DIAGNOSIS — H43811 Vitreous degeneration, right eye: Secondary | ICD-10-CM | POA: Diagnosis not present

## 2022-08-26 DIAGNOSIS — D3141 Benign neoplasm of right ciliary body: Secondary | ICD-10-CM | POA: Diagnosis not present

## 2022-08-26 DIAGNOSIS — H35371 Puckering of macula, right eye: Secondary | ICD-10-CM | POA: Diagnosis not present

## 2022-08-26 DIAGNOSIS — Z961 Presence of intraocular lens: Secondary | ICD-10-CM | POA: Diagnosis not present

## 2022-08-26 DIAGNOSIS — H04121 Dry eye syndrome of right lacrimal gland: Secondary | ICD-10-CM | POA: Diagnosis not present

## 2022-08-27 DIAGNOSIS — M5459 Other low back pain: Secondary | ICD-10-CM | POA: Diagnosis not present

## 2022-08-27 DIAGNOSIS — M47816 Spondylosis without myelopathy or radiculopathy, lumbar region: Secondary | ICD-10-CM | POA: Diagnosis not present

## 2022-08-27 DIAGNOSIS — M6281 Muscle weakness (generalized): Secondary | ICD-10-CM | POA: Diagnosis not present

## 2022-09-01 DIAGNOSIS — M47816 Spondylosis without myelopathy or radiculopathy, lumbar region: Secondary | ICD-10-CM | POA: Diagnosis not present

## 2022-09-01 DIAGNOSIS — M6281 Muscle weakness (generalized): Secondary | ICD-10-CM | POA: Diagnosis not present

## 2022-09-01 DIAGNOSIS — M5459 Other low back pain: Secondary | ICD-10-CM | POA: Diagnosis not present

## 2022-09-03 DIAGNOSIS — M6281 Muscle weakness (generalized): Secondary | ICD-10-CM | POA: Diagnosis not present

## 2022-09-03 DIAGNOSIS — M47816 Spondylosis without myelopathy or radiculopathy, lumbar region: Secondary | ICD-10-CM | POA: Diagnosis not present

## 2022-09-03 DIAGNOSIS — M5459 Other low back pain: Secondary | ICD-10-CM | POA: Diagnosis not present

## 2022-09-08 DIAGNOSIS — M6281 Muscle weakness (generalized): Secondary | ICD-10-CM | POA: Diagnosis not present

## 2022-09-08 DIAGNOSIS — M47816 Spondylosis without myelopathy or radiculopathy, lumbar region: Secondary | ICD-10-CM | POA: Diagnosis not present

## 2022-09-08 DIAGNOSIS — M5459 Other low back pain: Secondary | ICD-10-CM | POA: Diagnosis not present

## 2022-09-11 DIAGNOSIS — M47816 Spondylosis without myelopathy or radiculopathy, lumbar region: Secondary | ICD-10-CM | POA: Diagnosis not present

## 2022-09-11 DIAGNOSIS — M5459 Other low back pain: Secondary | ICD-10-CM | POA: Diagnosis not present

## 2022-09-11 DIAGNOSIS — M6281 Muscle weakness (generalized): Secondary | ICD-10-CM | POA: Diagnosis not present

## 2022-09-12 ENCOUNTER — Other Ambulatory Visit: Payer: Self-pay | Admitting: Cardiovascular Disease

## 2022-09-15 DIAGNOSIS — M6281 Muscle weakness (generalized): Secondary | ICD-10-CM | POA: Diagnosis not present

## 2022-09-15 DIAGNOSIS — M5459 Other low back pain: Secondary | ICD-10-CM | POA: Diagnosis not present

## 2022-09-15 DIAGNOSIS — M47816 Spondylosis without myelopathy or radiculopathy, lumbar region: Secondary | ICD-10-CM | POA: Diagnosis not present

## 2022-09-18 DIAGNOSIS — M47816 Spondylosis without myelopathy or radiculopathy, lumbar region: Secondary | ICD-10-CM | POA: Diagnosis not present

## 2022-09-18 DIAGNOSIS — M5459 Other low back pain: Secondary | ICD-10-CM | POA: Diagnosis not present

## 2022-09-18 DIAGNOSIS — M6281 Muscle weakness (generalized): Secondary | ICD-10-CM | POA: Diagnosis not present

## 2022-09-22 DIAGNOSIS — M6281 Muscle weakness (generalized): Secondary | ICD-10-CM | POA: Diagnosis not present

## 2022-09-22 DIAGNOSIS — M5459 Other low back pain: Secondary | ICD-10-CM | POA: Diagnosis not present

## 2022-09-22 DIAGNOSIS — M47816 Spondylosis without myelopathy or radiculopathy, lumbar region: Secondary | ICD-10-CM | POA: Diagnosis not present

## 2022-09-24 DIAGNOSIS — M5459 Other low back pain: Secondary | ICD-10-CM | POA: Diagnosis not present

## 2022-09-24 DIAGNOSIS — M47816 Spondylosis without myelopathy or radiculopathy, lumbar region: Secondary | ICD-10-CM | POA: Diagnosis not present

## 2022-09-24 DIAGNOSIS — M6281 Muscle weakness (generalized): Secondary | ICD-10-CM | POA: Diagnosis not present

## 2022-09-30 DIAGNOSIS — M47816 Spondylosis without myelopathy or radiculopathy, lumbar region: Secondary | ICD-10-CM | POA: Diagnosis not present

## 2022-09-30 DIAGNOSIS — M5459 Other low back pain: Secondary | ICD-10-CM | POA: Diagnosis not present

## 2022-09-30 DIAGNOSIS — M6281 Muscle weakness (generalized): Secondary | ICD-10-CM | POA: Diagnosis not present

## 2022-11-25 DIAGNOSIS — I270 Primary pulmonary hypertension: Secondary | ICD-10-CM | POA: Diagnosis not present

## 2022-11-25 DIAGNOSIS — G4733 Obstructive sleep apnea (adult) (pediatric): Secondary | ICD-10-CM | POA: Diagnosis not present

## 2022-11-25 DIAGNOSIS — N183 Chronic kidney disease, stage 3 unspecified: Secondary | ICD-10-CM | POA: Diagnosis not present

## 2022-11-25 DIAGNOSIS — I700 Atherosclerosis of aorta: Secondary | ICD-10-CM | POA: Diagnosis not present

## 2022-11-25 DIAGNOSIS — I10 Essential (primary) hypertension: Secondary | ICD-10-CM | POA: Diagnosis not present

## 2022-11-25 DIAGNOSIS — I4891 Unspecified atrial fibrillation: Secondary | ICD-10-CM | POA: Diagnosis not present

## 2022-11-25 DIAGNOSIS — R7301 Impaired fasting glucose: Secondary | ICD-10-CM | POA: Diagnosis not present

## 2022-11-25 DIAGNOSIS — D692 Other nonthrombocytopenic purpura: Secondary | ICD-10-CM | POA: Diagnosis not present

## 2022-11-25 DIAGNOSIS — E782 Mixed hyperlipidemia: Secondary | ICD-10-CM | POA: Diagnosis not present

## 2022-11-25 DIAGNOSIS — Z0000 Encounter for general adult medical examination without abnormal findings: Secondary | ICD-10-CM | POA: Diagnosis not present

## 2022-11-25 DIAGNOSIS — D6869 Other thrombophilia: Secondary | ICD-10-CM | POA: Diagnosis not present

## 2022-11-25 DIAGNOSIS — R809 Proteinuria, unspecified: Secondary | ICD-10-CM | POA: Diagnosis not present

## 2022-12-27 NOTE — Progress Notes (Signed)
Cardiology Office Note   Date:  12/28/2022   ID:  Joe [name], DOB 21-Oct-1940, MRN [id number]  PCP:  [name], MD  Cardiologist:  Dr. Johnsie Cancel    No chief complaint on file.     History of Present Illness: Joe [name] is a 82 y.o. male who presents for follow up for AS,  PAF, diastolic CHF and edema Also history of HTN, OSA RBBB  July 2018 Had edema TTE 08/18/18 with mild AS normal EF and grade 2 diastolic dysfunction  He saw K. [place], [place] 04/28/17 with c/o shortness of breath and LLE, reports of irregular HB. 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 Wife has Alzheimers   Most recent [facility]ho 10/10/20 with normal EF and mild AS mean gradient only 8 mmHg   Long discussion about need for weight loss He indicates doing some walking and water aerobics but weight is still too high Not eating as much ice cream at night   Seen by Dr Marlou Porch for palpitations 07/17/22 with only PAC and no PAF Monitor done 08/16/22 with no PAF and only PAC/PVCls latter associated with symptom of fluttering     Past Medical History:  Diagnosis Date   Depression    Hyperlipidemia    Hypertension    Primary localized osteoarthritis of left knee    Primary localized osteoarthritis of right knee    Shortness of breath dyspnea    Sleep apnea    CPAP   Syncope 12/11/2012    Past Surgical History:  Procedure Laterality Date   EYE SURGERY Left prosthesis   KNEE SURGERY Bilateral    left eye  1959   traumatic loss of left eye   ROTATOR CUFF REPAIR     TOTAL KNEE ARTHROPLASTY Right 09/17/2014   Procedure: RIGHT TOTAL KNEE ARTHROPLASTY Steroid injection LEFT KNEE;  Surgeon: [name], MD;  Location: [place];  Service: Orthopedics;  Laterality: Right;   TOTAL KNEE ARTHROPLASTY Left 03/25/2015   Procedure: TOTAL KNEE ARTHROPLASTY;  Surgeon: [name], MD;  Location: [place];  Service: Orthopedics;  Laterality: Left;     Current Outpatient Medications  Medication Sig Dispense Refill   chlorthalidone (HYGROTON) 50 MG tablet TAKE 1 TABLET BY MOUTH EVERY MORNING WITH FOOD     citalopram (CELEXA) 20 MG tablet Take 20 mg by mouth daily.     hydrochlorothiazide (HYDRODIURIL) 25 MG tablet Take 25 mg by mouth daily.     IRON PO Take 1 tablet by mouth daily.     Multiple Vitamin (MV-ONE) CAPS Take 1 capsule by mouth daily.     olmesartan (BENICAR) 40 MG tablet Take 40 mg by mouth daily.     Omega-3 Fatty Acids (FISH OIL PO) Take 1 tablet by mouth daily.     potassium chloride SA (KLOR-CON M) 20 MEQ tablet TAKE 1 TABLET BY MOUTH DAILY 90 tablet 2   simvastatin (ZOCOR) 20 MG tablet Take 20 mg by mouth daily.     Tamsulosin HCl (FLOMAX) 0.4 MG CAPS Take 0.4 mg by mouth daily.     traZODone (DESYREL) 50 MG tablet Take 50 mg by mouth at bedtime as needed. sleep  3   Turmeric 450 MG CAPS Take by mouth.     verapamil (CALAN-SR) 120 MG CR tablet Take 120 mg by  mouth daily.     XARELTO 20 MG TABS tablet TAKE 1 TABLET BY MOUTH  DAILY WITH SUPPER 100 tablet 2   No current facility-administered medications for this visit.    Allergies:   Demerol [meperidine], Meloxicam, Keflex [cephalexin], and Penicillins    Social History:  The patient  reports that he quit smoking about 26 years ago. His smoking use included cigarettes. He has never used smokeless tobacco. He reports current alcohol use. He reports that he does not use drugs.   Family History:  The patient's family history includes Diabetes in his maternal grandmother; Heart attack in his brother and father; Heart disease in his mother; Hypertension in his father and mother.    ROS:  General:no colds or fevers, no weight changes Skin:no rashes or ulcers HEENT:no blurred vision, no congestion CV:see HPI PUL:see HPI GI:no diarrhea constipation or melena, no indigestion GU:no hematuria, no  dysuria MS:no joint pain, no claudication Neuro:no syncope, no lightheadedness Endo:no diabetes, no thyroid disease  Wt Readings from Last 3 Encounters:  07/17/22 282 lb (127.9 kg)  11/04/21 275 lb 9.6 oz (125 kg)  04/10/20 276 lb (125.2 kg)     PHYSICAL EXAM: VS:  There were no vitals taken for this visit. , BMI There is no height or weight on file to calculate BMI.   Affect appropriate Obese male  HEENT: Blind right eye  Neck supple with no adenopathy JVP normal no bruits no thyromegaly Lungs clear with no wheezing and good diaphragmatic motion Heart:  S1/S2 AS murmur, no rub, gallop or click PMI normal Abdomen: benighn, BS positve, no tenderness, no AAA no bruit.  No HSM or HJR Distal pulses intact with no bruits Plus one bilateral  Edema worse on left  Neuro non-focal Skin warm and dry No muscular weakness  EKG:   05/19/17 SR RBBB rate 81 PVC;s 09/29/18 SR rate 65  ICRBBB  12/28/2022 NSR rate 61 PVC isolated otherwise normal   Recent Labs: No results found for requested labs within last 365 days.    Lipid Panel No results found for: "CHOL", "TRIG", "HDL", "CHOLHDL", "VLDL", "LDLCALC", "LDLDIRECT"     Other studies Reviewed: Additional studies/ records that were reviewed today include: . ECHO  10/10/20   IMPRESSIONS     1. Left ventricular ejection fraction, by estimation, is 55 to 60%. The  left ventricle has normal function. The left ventricle has no regional  wall motion abnormalities. There is moderate left ventricular hypertrophy.  Left ventricular diastolic  parameters are consistent with Grade I diastolic dysfunction (impaired  relaxation).   2. Right ventricular systolic function is normal. The right ventricular  size is normal. There is normal pulmonary artery systolic pressure.   3. The mitral valve is grossly normal. No evidence of mitral valve  regurgitation.   4. The aortic valve is tricuspid. Aortic valve regurgitation is not  visualized. Mild  to moderate aortic valve sclerosis/calcification is  present, without any evidence of aortic stenosis. Aortic valve mean  gradient measures 8.0 mmHg.   5. The inferior vena cava is normal in size with greater than 50%  respiratory variability, suggesting right atrial pressure of 3 mmHg.   Comparison(s): Changes from prior study are noted. 10/05/2019: LVEF  65-70%, mild aortic stenosis (mean gradient 14 mmHg).     GXT 09/09/15 Study Highlights Blood pressure demonstrated a hypertensive response to exercise. There was no ST segment deviation noted during stress.      Monitor 08/16/22    Sinus  Rhythm avg HR of 66 bpm   Rare PAC's   Occasional PVC's (1.4%)   Rare brief atrial tachycardia - benign.   No atrial fibrillation   Occasional symptoms of fluttering assciated with PVC's. Sometimes normal sinus rhythm.   Overall reassuring monitor. Candee Furbish, MD  ASSESSMENT AND PLAN:  1.  Acute on chronic diastolic HF and in combination with a fib.  Improved continue diuretics   2.  PAF- maintaining SR.  On xarelto and verapamil Palpitations with no PAF on monitor 08/16/22  Continue xarelto and verapamil    3. HTN:  Well controlled.  Continue current medications and low sodium Dash type diet.    4. AS:  10/10/20 EF 55-60% mild AS mean gradient 8 peak 14 mmHg DVI 0.71 AVA 1.95 cm2 F/U echo ordered  Echo for AS   F/U in a year   [employer]

## 2022-12-28 ENCOUNTER — Ambulatory Visit: Payer: Medicare Other | Attending: Cardiovascular Disease | Admitting: Cardiovascular Disease

## 2022-12-28 VITALS — BP 144/50 | HR 67 | Ht 66.0 in | Wt 280.0 lb

## 2022-12-28 DIAGNOSIS — I10 Essential (primary) hypertension: Secondary | ICD-10-CM

## 2022-12-28 DIAGNOSIS — I5033 Acute on chronic diastolic (congestive) heart failure: Secondary | ICD-10-CM | POA: Diagnosis not present

## 2022-12-28 DIAGNOSIS — I350 Nonrheumatic aortic (valve) stenosis: Secondary | ICD-10-CM

## 2022-12-28 DIAGNOSIS — I480 Paroxysmal atrial fibrillation: Secondary | ICD-10-CM

## 2022-12-28 NOTE — Patient Instructions (Addendum)
Medication Instructions:  Your physician recommends that you continue on your current medications as directed. Please refer to the Current Medication list given to you today  *If you need a refill on your cardiac medications before your next appointment, please call your pharmacy*  Lab Work: If you have labs (blood work) drawn today and your tests are completely normal, you will receive your results only by: [place] (if you have MyChart) OR A paper copy in the mail If you have any lab test that is abnormal or we need to change your treatment, we will call you to review the results.  Testing/Procedures: Your physician has requested that you have an echocardiogram. Echocardiography is a painless test that uses sound waves to create images of your heart. It provides your doctor with information about the size and shape of your heart and how well your heart's chambers and valves are working. This procedure takes approximately one hour. There are no restrictions for this procedure. Please do NOT wear cologne, perfume, aftershave, or lotions (deodorant is allowed). Please arrive 15 minutes prior to your appointment time.  Follow-Up: At [facility], you and your health needs are our priority.  As part of our continuing mission to provide you with exceptional heart care, we have created designated Provider Care Teams.  These Care Teams include your primary Cardiologist (physician) and Advanced Practice Providers (APPs -  Physician Assistants and Nurse Practitioners) who all work together to provide you with the care you need, when you need it.  We recommend signing up for the patient portal called "MyChart".  Sign up information is provided on this After Visit Summary.  MyChart is used to connect with patients for Virtual Visits (Telemedicine).  Patients are able to view lab/test results, encounter notes, upcoming appointments, etc.  Non-urgent messages can be sent to your provider as  well.   To learn more about what you can do with MyChart, go to [email or web address].    Your next appointment:   1 year   Provider:   Jenkins Rouge, MD

## 2023-01-21 ENCOUNTER — Ambulatory Visit (HOSPITAL_COMMUNITY): Payer: Medicare Other | Attending: Cardiology

## 2023-01-21 DIAGNOSIS — I10 Essential (primary) hypertension: Secondary | ICD-10-CM | POA: Diagnosis not present

## 2023-01-21 DIAGNOSIS — I350 Nonrheumatic aortic (valve) stenosis: Secondary | ICD-10-CM

## 2023-01-21 DIAGNOSIS — I5033 Acute on chronic diastolic (congestive) heart failure: Secondary | ICD-10-CM | POA: Diagnosis not present

## 2023-01-21 DIAGNOSIS — I480 Paroxysmal atrial fibrillation: Secondary | ICD-10-CM | POA: Diagnosis not present

## 2023-01-21 LAB — ECHOCARDIOGRAM COMPLETE
AR max vel: 1.94 cm2
AV Area VTI: 2.04 cm2
AV Area mean vel: 1.97 cm2
AV Mean grad: 13 mmHg
AV Peak grad: 21.5 mmHg
Ao pk vel: 2.32 m/s
Area-P 1/2: 2.91 cm2
S' Lateral: 3.7 cm

## 2023-02-03 ENCOUNTER — Other Ambulatory Visit: Payer: Self-pay

## 2023-02-03 ENCOUNTER — Emergency Department (HOSPITAL_BASED_OUTPATIENT_CLINIC_OR_DEPARTMENT_OTHER): Payer: Medicare Other

## 2023-02-03 ENCOUNTER — Encounter (HOSPITAL_BASED_OUTPATIENT_CLINIC_OR_DEPARTMENT_OTHER): Payer: Self-pay | Admitting: Emergency Medicine

## 2023-02-03 ENCOUNTER — Emergency Department (HOSPITAL_BASED_OUTPATIENT_CLINIC_OR_DEPARTMENT_OTHER)
Admission: EM | Admit: 2023-02-03 | Discharge: 2023-02-03 | Disposition: A | Discharge status: Home / Self Care | Payer: Medicare Other | Attending: Emergency Medicine | Admitting: Emergency Medicine

## 2023-02-03 DIAGNOSIS — I509 Heart failure, unspecified: Secondary | ICD-10-CM | POA: Diagnosis not present

## 2023-02-03 DIAGNOSIS — R0682 Tachypnea, not elsewhere classified: Secondary | ICD-10-CM | POA: Insufficient documentation

## 2023-02-03 DIAGNOSIS — Z1152 Encounter for screening for COVID-19: Secondary | ICD-10-CM | POA: Insufficient documentation

## 2023-02-03 DIAGNOSIS — R221 Localized swelling, mass and lump, neck: Secondary | ICD-10-CM | POA: Diagnosis not present

## 2023-02-03 DIAGNOSIS — R0602 Shortness of breath: Secondary | ICD-10-CM | POA: Diagnosis not present

## 2023-02-03 DIAGNOSIS — J9811 Atelectasis: Secondary | ICD-10-CM | POA: Diagnosis not present

## 2023-02-03 DIAGNOSIS — Z7901 Long term (current) use of anticoagulants: Secondary | ICD-10-CM | POA: Diagnosis not present

## 2023-02-03 DIAGNOSIS — I110 Hypertensive heart disease with heart failure: Secondary | ICD-10-CM | POA: Diagnosis not present

## 2023-02-03 DIAGNOSIS — Z79899 Other long term (current) drug therapy: Secondary | ICD-10-CM | POA: Diagnosis not present

## 2023-02-03 DIAGNOSIS — R509 Fever, unspecified: Secondary | ICD-10-CM | POA: Insufficient documentation

## 2023-02-03 LAB — RESP PANEL BY RT-PCR (RSV, FLU A&B, COVID)  RVPGX2
Influenza A by PCR: NEGATIVE
Influenza B by PCR: NEGATIVE
Resp Syncytial Virus by PCR: NEGATIVE
SARS Coronavirus 2 by RT PCR: NEGATIVE

## 2023-02-03 LAB — BASIC METABOLIC PANEL
Anion gap: 9 (ref 5–15)
BUN: 22 mg/dL (ref 8–23)
CO2: 29 mmol/L (ref 22–32)
Calcium: 10.1 mg/dL (ref 8.9–10.3)
Chloride: 99 mmol/L (ref 98–111)
Creatinine, Ser: 1.09 mg/dL (ref 0.61–1.24)
GFR, Estimated: >60 mL/min (ref >60)
Glucose, Bld: 124 mg/dL — ABNORMAL HIGH (ref 70–99)
Potassium: 3.6 mmol/L (ref 3.5–5.1)
Sodium: 137 mmol/L (ref 135–145)

## 2023-02-03 LAB — URINALYSIS, ROUTINE W REFLEX MICROSCOPIC
Bacteria, UA: NONE SEEN
Bilirubin Urine: NEGATIVE
Glucose, UA: NEGATIVE mg/dL
Ketones, ur: NEGATIVE mg/dL
Leukocytes,Ua: NEGATIVE
Nitrite: NEGATIVE
Protein, ur: 30 mg/dL — AB
Specific Gravity, Urine: >1.046 — ABNORMAL HIGH (ref 1.005–1.030)
pH: 5.5 (ref 5.0–8.0)

## 2023-02-03 LAB — LACTIC ACID, PLASMA
Lactic Acid, Venous: 1.6 mmol/L (ref 0.5–1.9)
Lactic Acid, Venous: 0.9 mmol/L (ref 0.5–1.9)

## 2023-02-03 LAB — CBC WITH DIFFERENTIAL/PLATELET
Abs Immature Granulocytes: 0.09 10*3/uL — ABNORMAL HIGH (ref 0.00–0.07)
Basophils Absolute: 0 10*3/uL (ref 0.0–0.1)
Basophils Relative: 0 %
Eosinophils Absolute: 0.1 10*3/uL (ref 0.0–0.5)
Eosinophils Relative: 0 %
HCT: 44.3 % (ref 39.0–52.0)
Hemoglobin: 14.1 g/dL (ref 13.0–17.0)
Immature Granulocytes: 0 %
Lymphocytes Relative: 4 %
Lymphs Abs: 0.9 10*3/uL (ref 0.7–4.0)
MCH: 25.9 pg — ABNORMAL LOW (ref 26.0–34.0)
MCHC: 31.8 g/dL (ref 30.0–36.0)
MCV: 81.4 fL (ref 80.0–100.0)
Monocytes Absolute: 1.2 10*3/uL — ABNORMAL HIGH (ref 0.1–1.0)
Monocytes Relative: 6 %
Neutro Abs: 18.5 10*3/uL — ABNORMAL HIGH (ref 1.7–7.7)
Neutrophils Relative %: 90 %
Platelets: 264 10*3/uL (ref 150–400)
RBC: 5.44 MIL/uL (ref 4.22–5.81)
RDW: 14.3 % (ref 11.5–15.5)
WBC: 20.7 10*3/uL — ABNORMAL HIGH (ref 4.0–10.5)
nRBC: 0 % (ref 0.0–0.2)

## 2023-02-03 LAB — GROUP A STREP BY PCR: Group A Strep by PCR: NOT DETECTED

## 2023-02-03 LAB — BRAIN NATRIURETIC PEPTIDE: B Natriuretic Peptide: 125.4 pg/mL — ABNORMAL HIGH (ref 0.0–100.0)

## 2023-02-03 MED ORDER — IOHEXOL 300 MG/ML  SOLN
100.0000 mL | Freq: Once | INTRAMUSCULAR | Status: AC | PRN
  Administered 2023-02-03: 100 mL via INTRAVENOUS

## 2023-02-03 MED ORDER — ONDANSETRON HCL 4 MG/2ML IJ SOLN
4.0000 mg | Freq: Once | INTRAMUSCULAR | Status: AC
  Administered 2023-02-03: 4 mg via INTRAVENOUS
  Filled 2023-02-03: qty 2

## 2023-02-03 MED ORDER — ACETAMINOPHEN 500 MG PO TABS
1000.0000 mg | ORAL_TABLET | Freq: Once | ORAL | Status: AC
  Administered 2023-02-03: 1000 mg via ORAL
  Filled 2023-02-03: qty 2

## 2023-02-03 NOTE — ED Triage Notes (Signed)
Pt arrives pov, slow gait with c/o acute shob at ~ 12pm today. Pt vomited in triage. Endorses hx of afib. Also c/o acute neck pain. Pt is breathing with pursed lips, denies CP

## 2023-02-03 NOTE — ED Notes (Signed)
Unable to get second culture drawn due to poor vein selection.

## 2023-02-03 NOTE — ED Notes (Signed)
Pt is aware urine is needed for urine sample.

## 2023-02-03 NOTE — ED Provider Notes (Signed)
[place] EMERGENCY DEPARTMENT AT [facility] Provider Note   CSN: [id number] Arrival date & time: 02/03/23  1619     History  Chief Complaint  Patient presents with   Shortness of Breath    Joe [name] is a 82 y.o. male.  Pt is a 82 yo male with pmhx significant for HTN, depression, HLD, sleep apnea on CPAP, OA, afib (on Xarelto), and CHF.  Pt said he was fine until after lunch.  He was sitting down and doing his crossword puzzle and developed chills.  He developed sob.  He lives in a retirement home and the nurse checked on him and told him to get evaluated.  He did not have a fever when she saw him, however has one now.  He vomited in triage, but denies current nausea.  No known sick contacts.       Home Medications Prior to Admission medications   Medication Sig Start Date End Date Taking? Authorizing Provider  chlorthalidone (HYGROTON) 50 MG tablet TAKE 1 TABLET BY MOUTH EVERY MORNING WITH FOOD    [provider]  citalopram (CELEXA) 20 MG tablet Take 20 mg by mouth daily.    [provider]  Ferrous Sulfate (IRON) 325 (65 Fe) MG TABS Take 1 tablet by mouth daily in the afternoon. 12/28/12   [provider]  Multiple Vitamin (MV-ONE) CAPS Take 1 capsule by mouth daily.    [provider]  olmesartan (BENICAR) 40 MG tablet Take 40 mg by mouth daily. 09/24/19   [provider]  Omega-3 Fatty Acids (FISH OIL PO) Take 1 tablet by mouth daily.    [provider]  potassium chloride SA (KLOR-CON M) 20 MEQ tablet TAKE 1 TABLET BY MOUTH DAILY 09/16/22   Wendall Stade, MD  simvastatin (ZOCOR) 20 MG tablet Take 20 mg by mouth daily. 02/22/16   [provider]  Tamsulosin HCl (FLOMAX) 0.4 MG CAPS Take 0.4 mg by mouth daily.    [provider]  traZODone (DESYREL) 50 MG tablet Take 50 mg by mouth at bedtime as needed. sleep 02/11/15   [provider]  Turmeric 450 MG CAPS Take by mouth.    [provider]  verapamil (CALAN-SR) 120 MG CR tablet Take 120 mg by mouth daily.    [provider]  XARELTO 20 MG TABS tablet TAKE 1 TABLET BY MOUTH  DAILY WITH SUPPER 04/06/22   Wendall Stade, MD      Allergies    Meperidine, Meloxicam, Keflex [cephalexin], and Penicillins    Review of Systems   Review of Systems  Constitutional:  Positive for chills and fever.  Respiratory:  Positive for cough and shortness of breath.   All other systems reviewed and are negative.   Physical Exam Updated Vital Signs BP (!) 157/66   Pulse 87   Temp 98.4 F (36.9 C) (Oral)   Resp 18   SpO2 93%  Physical Exam Vitals and nursing note reviewed.  Constitutional:      Appearance: He is well-developed. He is obese.  HENT:     Head: Normocephalic and atraumatic.     Mouth/Throat:     Mouth: Mucous membranes are moist.     Pharynx: Oropharynx is clear.  Eyes:     Comments: Left eye prosthesis  Cardiovascular:     Rate and Rhythm: Normal rate and regular rhythm.  Pulmonary:     Effort: Pulmonary effort is normal. Tachypnea present.  Breath sounds: Normal breath sounds.  Abdominal:     General: Bowel sounds are normal.     Palpations: Abdomen is soft.  Musculoskeletal:        General: Normal range of motion.     Cervical back: Normal range of motion and neck supple.  Skin:    General: Skin is warm.     Capillary Refill: Capillary refill takes less than 2 seconds.  Neurological:     General: No focal deficit present.     Mental Status: He is alert and oriented to person, place, and time.  Psychiatric:        Mood and Affect: Mood normal.        Behavior: Behavior normal.     ED Results / Procedures / Treatments   Labs (all labs ordered are listed, but only abnormal results are displayed) Labs Reviewed  BASIC METABOLIC PANEL - Abnormal; Notable for the following components:      Result Value   Glucose, Bld 124 (*)    All other components within normal limits  CBC  WITH DIFFERENTIAL/PLATELET - Abnormal; Notable for the following components:   WBC 20.7 (*)    MCH 25.9 (*)    Neutro Abs 18.5 (*)    Monocytes Absolute 1.2 (*)    Abs Immature Granulocytes 0.09 (*)    All other components within normal limits  BRAIN NATRIURETIC PEPTIDE - Abnormal; Notable for the following components:   B Natriuretic Peptide 125.4 (*)    All other components within normal limits  URINALYSIS, ROUTINE W REFLEX MICROSCOPIC - Abnormal; Notable for the following components:   Specific Gravity, Urine >1.046 (*)    Hgb urine dipstick SMALL (*)    Protein, ur 30 (*)    All other components within normal limits  RESP PANEL BY RT-PCR (RSV, FLU A&B, COVID)  RVPGX2  GROUP A STREP BY PCR  CULTURE, BLOOD (ROUTINE X 2)  CULTURE, BLOOD (ROUTINE X 2)  LACTIC ACID, PLASMA  LACTIC ACID, PLASMA    EKG EKG Interpretation  Date/Time:  Wednesday February 03 2023 16:37:34 EDT Ventricular Rate:  98 PR Interval:  146 QRS Duration: 120 QT Interval:  376 QTC Calculation: 480 R Axis:   -42 Text Interpretation: Sinus rhythm with frequent and consecutive Premature ventricular complexes Left axis deviation Right bundle branch block Abnormal ECG When compared with ECG of 19-May-2017 10:30, No significant change was found No significant change since last tracing Confirmed by Jacalyn Lefevre [phone]) on 02/03/2023 5:18:31 PM  Radiology CT Soft Tissue Neck W Contrast  Result Date: 02/03/2023 CLINICAL DATA:  Shortness of breath and neck pain EXAM: CT NECK WITH CONTRAST TECHNIQUE: Multidetector CT imaging of the neck was performed using the standard protocol following the bolus administration of intravenous contrast. RADIATION DOSE REDUCTION: This exam was performed according to the departmental dose-optimization program which includes automated exposure control, adjustment of the mA and/or kV according to patient size and/or use of iterative reconstruction technique. CONTRAST:  OMNIPAQUE IOHEXOL  300 MG/ML  SOLN COMPARISON:  None Available. FINDINGS: PHARYNX AND LARYNX: The nasopharynx, oropharynx and larynx are normal. Visible portions of the oral cavity, tongue base and floor of mouth are normal. Normal epiglottis, vallecula and pyriform sinuses. The larynx is normal. No retropharyngeal abscess, effusion or lymphadenopathy. SALIVARY GLANDS: Normal parotid, submandibular and sublingual glands. THYROID: Normal. LYMPH NODES: No enlarged or abnormal density lymph nodes. VASCULAR: Atherosclerotic calcification of both carotid bifurcations and at the aortic arch. LIMITED INTRACRANIAL: Normal. VISUALIZED  ORBITS: Absent left globe MASTOIDS AND VISUALIZED PARANASAL SINUSES: Complete opacification of the left maxillary sinus. SKELETON: No bony spinal canal stenosis. No lytic or blastic lesions. UPPER CHEST: Clear. OTHER: None. IMPRESSION: 1. No acute abnormality of the neck. Aortic Atherosclerosis (ICD10-I70.0). Electronically Signed   By: Deatra Robinson M.D.   On: 02/03/2023 19:05   CT Chest W Contrast  Result Date: 02/03/2023 CLINICAL DATA:  Shortness of breath.  Vomiting. EXAM: CT CHEST WITH CONTRAST TECHNIQUE: Multidetector CT imaging of the chest was performed during intravenous contrast administration. RADIATION DOSE REDUCTION: This exam was performed according to the departmental dose-optimization program which includes automated exposure control, adjustment of the mA and/or kV according to patient size and/or use of iterative reconstruction technique. CONTRAST:  OMNIPAQUE IOHEXOL 300 MG/ML  SOLN COMPARISON:  X-ray earlier 02/03/2023 FINDINGS: Cardiovascular: The heart is nonenlarged. No pericardial effusion. Coronary artery calcifications are seen. The thoracic aorta has a normal course and caliber with scattered vascular calcifications. There is some enlargement of the main pulmonary arteries. Please correlate for evidence of pulmonary artery hypertension. Mediastinum/Nodes: Mildly patulous  thoracic esophagus. No specific abnormal lymph node enlargement seen in the axillary region, hilum. There is a soft tissue nodule to the left of the upper trachea measuring 14 x 10 mm on series 3, image 25. No other definite abnormal lymph node enlargement seen in the mediastinum. Lungs/Pleura: Significant breathing motion throughout the examination limiting evaluation. There is some dependent atelectasis noted at the bases. No consolidation, pneumothorax or effusion. Upper Abdomen: In the upper abdomen the adrenal glands are incompletely included in the imaging field. Cystic lesion in the right hepatic lobe at the edge of the imaging field as well measuring 3.2 cm in diameter and SUV of near 0. Musculoskeletal: Diffuse degenerative changes are seen along the spine. IMPRESSION: Severe breathing motion limiting evaluation. Mild dependent atelectasis. No consolidation or effusion. Enlargement of the main pulmonary artery. Please correlate for pulmonary artery hypertension. Soft tissue nodule to the left of the upper trachea near the inferior margin of the left thyroid lobe. This could be a enlarged mediastinal node or an exophytic thyroid nodule. Please correlate for any known history. Please correlate with the separate neck CT scan. Otherwise follow up evaluation when appropriate. Aortic Atherosclerosis (ICD10-I70.0). Electronically Signed   By: Karen Kays M.D.   On: 02/03/2023 18:55   DG Chest Port 1 View  Result Date: 02/03/2023 CLINICAL DATA:  Shortness of breath EXAM: PORTABLE CHEST 1 VIEW COMPARISON:  Chest x-ray dated March 27, 2015 FINDINGS: The heart size and mediastinal contours are within normal limits. Bibasilar atelectasis. Both lungs are otherwise clear. The visualized skeletal structures are unremarkable. IMPRESSION: No active disease. Electronically Signed   By: Allegra Lai M.D.   On: 02/03/2023 17:34    Procedures Procedures    Medications Ordered in ED Medications  ondansetron  (ZOFRAN) injection 4 mg (4 mg Intravenous Given 02/03/23 1721)  acetaminophen (TYLENOL) tablet 1,000 mg (1,000 mg Oral Given 02/03/23 1721)  iohexol (OMNIPAQUE) 300 MG/ML solution 100 mL (100 mLs Intravenous Contrast Given 02/03/23 1817)    ED Course/ Medical Decision Making/ A&P                             Medical Decision Making Amount and/or Complexity of Data Reviewed Labs: ordered. Radiology: ordered.  Risk OTC drugs. Prescription drug management.   This patient presents to the ED for concern of sob, this involves  an extensive number of treatment options, and is a complaint that carries with it a high risk of complications and morbidity.  The differential diagnosis includes chf, pna, covid/flu/rsv, bronchitis   Co morbidities that complicate the patient evaluation  HTN, depression, HLD, sleep apnea on CPAP, OA, afib (on Xarelto), and CHF   Additional history obtained:  Additional history obtained from epic chart review  Lab Tests:  I Ordered, and personally interpreted labs.  The pertinent results include:  cbc with wbc elevated at 20.7; covid/flu/rsv neg, bmp nl, UA with some blood, but no infection.; lactic neg times 2   Imaging Studies ordered:  I ordered imaging studies including cxr, neck, ct chest  I independently visualized and interpreted imaging which showed  CXR: No active disease.  CT neck: . No acute abnormality of the neck.  CT Chest: Severe breathing motion limiting evaluation. Mild dependent  atelectasis. No consolidation or effusion.    Enlargement of the main pulmonary artery. Please correlate for  pulmonary artery hypertension.    Soft tissue nodule to the left of the upper trachea near the  inferior margin of the left thyroid lobe. This could be a enlarged  mediastinal node or an exophytic thyroid nodule. Please correlate  for any known history. Please correlate with the separate neck CT  scan. Otherwise follow up evaluation when appropriate.     Aortic Atherosclerosis (ICD10-I70.0).   I agree with the radiologist interpretation   Cardiac Monitoring:  The patient was maintained on a cardiac monitor.  I personally viewed and interpreted the cardiac monitored which showed an underlying rhythm of: nsr   Medicines ordered and prescription drug management:  I ordered medication including tylenol/zofran  for sx  Reevaluation of the patient after these medicines showed that the patient improved I have reviewed the patients home medicines and have made adjustments as needed   Test Considered:  ct   Critical Interventions:  Tylenol/zofran   Problem List / ED Course:  Fever:  likely viral etiology.  No evidence of pna or uti or covid/flu/rsv.  Pt feels much better.  He looks much better.  Lactic nl.  Blood cultures pending.  Pt is stable for d/c.  He is to return if worse.     Reevaluation:  After the interventions noted above, I reevaluated the patient and found that they have :improved   Social Determinants of Health:  Lives at    Dispostion:  After consideration of the diagnostic results and the patients response to treatment, I feel that the patent would benefit from discharge with outpatient f/u.          Final Clinical Impression(s) / ED Diagnoses Final diagnoses:  Febrile illness    Rx / DC Orders ED Discharge Orders     None         Jacalyn Lefevre, MD 02/03/23 2222

## 2023-02-05 DIAGNOSIS — L03116 Cellulitis of left lower limb: Secondary | ICD-10-CM | POA: Diagnosis not present

## 2023-02-06 LAB — CULTURE, BLOOD (ROUTINE X 2): Culture: NO GROWTH

## 2023-02-08 ENCOUNTER — Other Ambulatory Visit: Payer: Self-pay | Admitting: Cardiovascular Disease

## 2023-02-08 ENCOUNTER — Encounter: Payer: Self-pay | Admitting: Pharmacist

## 2023-02-08 LAB — CULTURE, BLOOD (ROUTINE X 2): Special Requests: ADEQUATE

## 2023-02-09 NOTE — Telephone Encounter (Signed)
Erroneous encounter

## 2023-02-11 ENCOUNTER — Ambulatory Visit: Payer: Medicare Other | Admitting: Cardiovascular Disease

## 2023-03-14 ENCOUNTER — Other Ambulatory Visit: Payer: Self-pay | Admitting: Cardiovascular Disease

## 2023-03-31 DIAGNOSIS — R519 Headache, unspecified: Secondary | ICD-10-CM | POA: Diagnosis not present

## 2023-03-31 DIAGNOSIS — H8113 Benign paroxysmal vertigo, bilateral: Secondary | ICD-10-CM | POA: Diagnosis not present

## 2023-05-26 DIAGNOSIS — N183 Chronic kidney disease, stage 3 unspecified: Secondary | ICD-10-CM | POA: Diagnosis not present

## 2023-05-26 DIAGNOSIS — E782 Mixed hyperlipidemia: Secondary | ICD-10-CM | POA: Diagnosis not present

## 2023-05-26 DIAGNOSIS — R7301 Impaired fasting glucose: Secondary | ICD-10-CM | POA: Diagnosis not present

## 2023-05-26 DIAGNOSIS — H8110 Benign paroxysmal vertigo, unspecified ear: Secondary | ICD-10-CM | POA: Diagnosis not present

## 2023-05-26 DIAGNOSIS — I129 Hypertensive chronic kidney disease with stage 1 through stage 4 chronic kidney disease, or unspecified chronic kidney disease: Secondary | ICD-10-CM | POA: Diagnosis not present

## 2023-05-26 LAB — LAB REPORT - SCANNED
A1c: 5.9
EGFR: 74

## 2023-07-20 ENCOUNTER — Other Ambulatory Visit: Payer: Self-pay

## 2023-07-20 DIAGNOSIS — I480 Paroxysmal atrial fibrillation: Secondary | ICD-10-CM

## 2023-07-20 MED ORDER — RIVAROXABAN 20 MG PO TABS
20.0000 mg | ORAL_TABLET | Freq: Every day | ORAL | 1 refills | Status: DC
Start: 2023-07-20 — End: 2023-07-21

## 2023-07-20 MED ORDER — RIVAROXABAN 20 MG PO TABS
20.0000 mg | ORAL_TABLET | Freq: Every day | ORAL | 1 refills | Status: DC
Start: 2023-07-20 — End: 2023-07-20

## 2023-07-20 MED ORDER — POTASSIUM CHLORIDE CRYS ER 20 MEQ PO TBCR: 20.0000 meq | EXTENDED_RELEASE_TABLET | Freq: Every day | ORAL | 1 refills | Status: DC

## 2023-07-20 NOTE — Addendum Note (Signed)
Addended by: Betsy Coder B on: 07/20/2023 01:58 PM   Modules accepted: Orders

## 2023-07-20 NOTE — Telephone Encounter (Signed)
Prescription refill request for Xarelto received.  Indication: Afib  Last office visit: 12/28/22 Eden Emms)  Weight: 127kg Age: 82 Scr: 1.02 (05/26/23)  CrCl: 103.74ml/min  Appropriate dose. Refill sent.

## 2023-07-21 MED ORDER — RIVAROXABAN 20 MG PO TABS
20.0000 mg | ORAL_TABLET | Freq: Every day | ORAL | 1 refills | Status: DC
Start: 2023-07-21 — End: 2023-09-06

## 2023-07-21 NOTE — Addendum Note (Signed)
Addended by: Betsy Coder B on: 07/21/2023 09:12 AM   Modules accepted: Orders

## 2023-08-30 DIAGNOSIS — D3141 Benign neoplasm of right ciliary body: Secondary | ICD-10-CM | POA: Diagnosis not present

## 2023-08-30 DIAGNOSIS — H524 Presbyopia: Secondary | ICD-10-CM | POA: Diagnosis not present

## 2023-08-30 DIAGNOSIS — H35371 Puckering of macula, right eye: Secondary | ICD-10-CM | POA: Diagnosis not present

## 2023-08-30 DIAGNOSIS — R7309 Other abnormal glucose: Secondary | ICD-10-CM | POA: Diagnosis not present

## 2023-09-06 ENCOUNTER — Other Ambulatory Visit: Payer: Self-pay | Admitting: Cardiovascular Disease

## 2023-09-06 DIAGNOSIS — I480 Paroxysmal atrial fibrillation: Secondary | ICD-10-CM

## 2023-09-06 NOTE — Telephone Encounter (Signed)
Prescription refill request for Xarelto received.  Indication:afib Last office visit:3/24 Weight:127  kg Age:82 Scr:1.02  8/24 CrCl:102.03  ml/min  Prescription refilled

## 2023-11-29 DIAGNOSIS — Z0000 Encounter for general adult medical examination without abnormal findings: Secondary | ICD-10-CM | POA: Diagnosis not present

## 2023-11-29 DIAGNOSIS — R809 Proteinuria, unspecified: Secondary | ICD-10-CM | POA: Diagnosis not present

## 2023-11-29 DIAGNOSIS — R7301 Impaired fasting glucose: Secondary | ICD-10-CM | POA: Diagnosis not present

## 2023-11-29 DIAGNOSIS — M549 Dorsalgia, unspecified: Secondary | ICD-10-CM | POA: Diagnosis not present

## 2023-11-29 DIAGNOSIS — E782 Mixed hyperlipidemia: Secondary | ICD-10-CM | POA: Diagnosis not present

## 2023-11-29 DIAGNOSIS — I700 Atherosclerosis of aorta: Secondary | ICD-10-CM | POA: Diagnosis not present

## 2023-11-29 DIAGNOSIS — I130 Hypertensive heart and chronic kidney disease with heart failure and stage 1 through stage 4 chronic kidney disease, or unspecified chronic kidney disease: Secondary | ICD-10-CM | POA: Diagnosis not present

## 2023-11-29 DIAGNOSIS — I4891 Unspecified atrial fibrillation: Secondary | ICD-10-CM | POA: Diagnosis not present

## 2023-11-29 DIAGNOSIS — N183 Chronic kidney disease, stage 3 unspecified: Secondary | ICD-10-CM | POA: Diagnosis not present

## 2023-11-29 DIAGNOSIS — I5032 Chronic diastolic (congestive) heart failure: Secondary | ICD-10-CM | POA: Diagnosis not present

## 2023-11-29 LAB — LAB REPORT - SCANNED
A1c: 6.2
Creatinine, POC: 118 mg/dL
EGFR: 73
Microalb Creat Ratio: 49.9
Microalbumin, Urine: 5.88

## 2024-01-11 ENCOUNTER — Other Ambulatory Visit: Payer: Self-pay | Admitting: Cardiovascular Disease

## 2024-01-17 ENCOUNTER — Other Ambulatory Visit: Payer: Self-pay | Admitting: Cardiovascular Disease

## 2024-01-17 DIAGNOSIS — I480 Paroxysmal atrial fibrillation: Secondary | ICD-10-CM

## 2024-01-17 NOTE — Telephone Encounter (Signed)
 Prescription refill request for Xarelto received.  Indication:afib Last office visit:3/24 Weight:127  kg Age:83 Scr:1.03  2/25 CrCl:101.04  ml/min  Prescription refilled

## 2024-02-23 ENCOUNTER — Ambulatory Visit: Payer: Medicare Other | Admitting: Cardiovascular Disease

## 2024-03-21 DIAGNOSIS — M47816 Spondylosis without myelopathy or radiculopathy, lumbar region: Secondary | ICD-10-CM | POA: Diagnosis not present

## 2024-03-27 NOTE — Progress Notes (Signed)
 Cardiology Office Note   Date:  04/03/2024   ID:  Joe [name], DOB [birth year], MRN [id number]  PCP:  Joe Landau, MD  Cardiologist:  Dr. Stann Earnest    Chief Complaint  Patient presents with   Atrial Fibrillation   Follow-up      History of Present Illness: Joe [name] is a 83 y.o. male who presents for follow up for AS,  PAF, diastolic CHF and edema Also history of HTN, OSA RBBB  July 2018 Had edema TTE 08/18/18 with mild AS normal EF and grade 2 diastolic dysfunction  He saw Joe [name], [name] 04/28/17 with c/o shortness of breath and LLE, reports of irregular HB. 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   With season tickets He lost his wife 2 years ago with dementia His dog passed as well Living at [facility] independent living   Most recent ECho 10/10/20 with normal EF and mild AS mean gradient only 8 mmHg   Long discussion about need for weight loss He indicates doing some walking and water  aerobics but weight is still too high Not eating as much ice cream at night   Seen by Dr Renna Cary for palpitations 07/17/22 with only PAC and no PAF Monitor done 08/16/22 with no PAF and only PAC/PVCls latter associated with symptom of fluttering   Echo 01/21/23: EF 55-60% mild AS  mean gradient 13 peak 21.5 mmhg AVA 1.94 cm2 DVI 0.72     Past Medical History:  Diagnosis Date   Depression    Hyperlipidemia    Hypertension    Primary localized osteoarthritis of left knee    Primary localized osteoarthritis of right knee    Shortness of breath dyspnea    Sleep apnea    CPAP   Syncope 12/11/2012    Past Surgical History:  Procedure Laterality Date   EYE SURGERY Left prosthesis   JOINT REPLACEMENT     KNEE SURGERY Bilateral    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   ROTATOR CUFF REPAIR     TOTAL KNEE ARTHROPLASTY Right 09/17/2014   Procedure: RIGHT TOTAL KNEE ARTHROPLASTY Steroid  injection LEFT KNEE;  Surgeon: Genevie Kerns, MD;  Location: [facility] OR;  Service: Orthopedics;  Laterality: Right;   TOTAL KNEE ARTHROPLASTY Left 03/25/2015   Procedure: TOTAL KNEE ARTHROPLASTY;  Surgeon: [name], MD;  Location: [facility] OR;  Service: Orthopedics;  Laterality: Left;     Current Outpatient Medications  Medication Sig Dispense Refill   chlorthalidone (HYGROTON) 50 MG tablet TAKE 1 TABLET BY MOUTH EVERY MORNING WITH FOOD     citalopram  (CELEXA ) 20 MG tablet Take 20 mg by mouth daily.     Ferrous Sulfate (IRON) 325 (65 Fe) MG TABS Take 1 tablet by mouth daily in the afternoon.     Multiple Vitamin (MV-ONE) CAPS Take 1 capsule by mouth daily.     olmesartan (BENICAR) 40 MG tablet Take 40 mg by mouth daily.     Omega-3 Fatty Acids (FISH OIL PO) Take 1 tablet by mouth daily.     potassium chloride  SA (KLOR-CON  M) 20 MEQ tablet Take 1 tablet (20 mEq total) by mouth daily. 100 tablet 0   rivaroxaban  (XARELTO ) 20 MG TABS tablet TAKE 1 TABLET BY MOUTH ONCE DAILY *TAKE WITH FOOD* 90 tablet 3   simvastatin  (ZOCOR ) 20 MG tablet Take 20 mg by mouth daily.  Tamsulosin  HCl (FLOMAX ) 0.4 MG CAPS Take 0.4 mg by mouth daily.     traZODone  (DESYREL ) 50 MG tablet Take 50 mg by mouth at bedtime as needed. sleep  3   Turmeric 450 MG CAPS Take by mouth.     verapamil  (CALAN -SR) 120 MG CR tablet Take 120 mg by mouth daily.     No current facility-administered medications for this visit.    Allergies:   Meperidine, Meloxicam , Keflex [cephalexin], and Penicillins    Social History:  The patient  reports that he quit smoking about 27 years ago. His smoking use included cigarettes. He has never used smokeless tobacco. He reports current alcohol use. He reports that he does not use drugs.   Family History:  The patient's family history includes Diabetes in his maternal grandmother; Heart attack in his brother and father; Heart disease in his mother; Hypertension in his father and mother.    ROS:   General:no colds or fevers, no weight changes Skin:no rashes or ulcers HEENT:no blurred vision, no congestion CV:see HPI PUL:see HPI GI:no diarrhea constipation or melena, no indigestion GU:no hematuria, no dysuria MS:no joint pain, no claudication Neuro:no syncope, no lightheadedness Endo:no diabetes, no thyroid disease  Wt Readings from Last 3 Encounters:  04/03/24 274 lb (124.3 kg)  12/28/22 280 lb (127 kg)  07/17/22 282 lb (127.9 kg)     PHYSICAL EXAM: VS:  BP 124/68 (BP Location: Left Arm, Patient Position: Sitting)   Pulse 64   Resp 16   Ht 5' 6 (1.676 m)   Wt 274 lb (124.3 kg)   SpO2 94%   BMI 44.22 kg/m  , BMI Body mass index is 44.22 kg/m.   Affect appropriate Obese male  HEENT: Blind right eye  Neck supple with no adenopathy JVP normal no bruits no thyromegaly Lungs clear with no wheezing and good diaphragmatic motion Heart:  S1/S2 AS murmur, no rub, gallop or click PMI normal Abdomen: benighn, BS positve, no tenderness, no AAA no bruit.  No HSM or HJR Distal pulses intact with no bruits Plus one bilateral  Edema worse on left  Neuro non-focal Skin warm and dry No muscular weakness  EKG:   05/19/17 SR RBBB rate 81 PVC;s 09/29/18 SR rate 65  ICRBBB  04/03/2024 NSR rate 61 PVC isolated otherwise normal   Recent Labs: No results found for requested labs within last 365 days.    Lipid Panel No results found for: CHOL, TRIG, HDL, CHOLHDL, VLDL, LDLCALC, LDLDIRECT     Other studies Reviewed: Additional studies/ records that were reviewed today include: . ECHO 01/21/23   AORTIC VALVE  AV Area (Vmax):    1.94 cm  AV Area (Vmean):   1.97 cm  AV Area (VTI):     2.04 cm  AV Vmax:           231.80 cm/s  AV Vmean:          161.400 cm/s  AV VTI:            0.470 m  AV Peak Grad:      21.5 mmHg  AV Mean Grad:      13.0 mmHg  LVOT Vmax:         159.00 cm/s  LVOT Vmean:        112.000 cm/s  LVOT VTI:          0.338 m  LVOT/AV VTI ratio:  0.72       GXT 09/09/15 Study Highlights Blood pressure  demonstrated a hypertensive response to exercise. There was no ST segment deviation noted during stress.      Monitor 08/16/22    Sinus Rhythm avg HR of 66 bpm   Rare PAC's   Occasional PVC's (1.4%)   Rare brief atrial tachycardia - benign.   No atrial fibrillation   Occasional symptoms of fluttering assciated with PVC's. Sometimes normal sinus rhythm.   Overall reassuring monitor. Dorothye Gathers, MD  ASSESSMENT AND PLAN:  1.  Acute on chronic diastolic HF and in combination with a fib.  Improved continue diuretics   2.  PAF- maintaining SR.  On xarelto  and verapamil  Palpitations with no PAF on monitor 08/16/22  Continue xarelto  and verapamil     3. HTN:  Well controlled.  Continue current medications and low sodium Dash type diet.    4. AS:   mild on TTE 01/21/23 f/u echo 01/2025   Echo for AS  01/20/2025   F/U in a year   Janelle Mediate

## 2024-04-03 ENCOUNTER — Encounter: Payer: Self-pay | Admitting: Cardiovascular Disease

## 2024-04-03 ENCOUNTER — Ambulatory Visit: Payer: Medicare Other | Attending: Cardiovascular Disease | Admitting: Cardiovascular Disease

## 2024-04-03 VITALS — BP 124/68 | HR 64 | Resp 16 | Ht 66.0 in | Wt 274.0 lb

## 2024-04-03 DIAGNOSIS — I480 Paroxysmal atrial fibrillation: Secondary | ICD-10-CM

## 2024-04-03 DIAGNOSIS — I10 Essential (primary) hypertension: Secondary | ICD-10-CM

## 2024-04-03 NOTE — Patient Instructions (Signed)
 Medication Instructions:  No changes *If you need a refill on your cardiac medications before your next appointment, please call your pharmacy*  Lab Work: none  Testing/Procedures: none  Follow-Up: At [facility], you and your health needs are our priority.  As part of our continuing mission to provide you with exceptional heart care, our providers are all part of one team.  This team includes your primary Cardiologist (physician) and Advanced Practice Providers or APPs (Physician Assistants and Nurse Practitioners) who all work together to provide you with the care you need, when you need it.  Your next appointment:   12 month(s)  Provider:   Janelle Mediate, MD

## 2024-04-12 DIAGNOSIS — M545 Low back pain, unspecified: Secondary | ICD-10-CM | POA: Diagnosis not present

## 2024-04-12 DIAGNOSIS — M47816 Spondylosis without myelopathy or radiculopathy, lumbar region: Secondary | ICD-10-CM | POA: Diagnosis not present

## 2024-04-12 DIAGNOSIS — M6281 Muscle weakness (generalized): Secondary | ICD-10-CM | POA: Diagnosis not present

## 2024-04-17 DIAGNOSIS — I10 Essential (primary) hypertension: Secondary | ICD-10-CM | POA: Diagnosis not present

## 2024-04-17 DIAGNOSIS — I5032 Chronic diastolic (congestive) heart failure: Secondary | ICD-10-CM | POA: Diagnosis not present

## 2024-04-20 DIAGNOSIS — M545 Low back pain, unspecified: Secondary | ICD-10-CM | POA: Diagnosis not present

## 2024-04-20 DIAGNOSIS — M47816 Spondylosis without myelopathy or radiculopathy, lumbar region: Secondary | ICD-10-CM | POA: Diagnosis not present

## 2024-04-20 DIAGNOSIS — M6281 Muscle weakness (generalized): Secondary | ICD-10-CM | POA: Diagnosis not present

## 2024-04-25 DIAGNOSIS — M47816 Spondylosis without myelopathy or radiculopathy, lumbar region: Secondary | ICD-10-CM | POA: Diagnosis not present

## 2024-04-25 DIAGNOSIS — M6281 Muscle weakness (generalized): Secondary | ICD-10-CM | POA: Diagnosis not present

## 2024-04-25 DIAGNOSIS — M545 Low back pain, unspecified: Secondary | ICD-10-CM | POA: Diagnosis not present

## 2024-04-27 DIAGNOSIS — M545 Low back pain, unspecified: Secondary | ICD-10-CM | POA: Diagnosis not present

## 2024-04-27 DIAGNOSIS — M6281 Muscle weakness (generalized): Secondary | ICD-10-CM | POA: Diagnosis not present

## 2024-04-27 DIAGNOSIS — M47816 Spondylosis without myelopathy or radiculopathy, lumbar region: Secondary | ICD-10-CM | POA: Diagnosis not present

## 2024-05-02 DIAGNOSIS — M545 Low back pain, unspecified: Secondary | ICD-10-CM | POA: Diagnosis not present

## 2024-05-02 DIAGNOSIS — M6281 Muscle weakness (generalized): Secondary | ICD-10-CM | POA: Diagnosis not present

## 2024-05-02 DIAGNOSIS — M47816 Spondylosis without myelopathy or radiculopathy, lumbar region: Secondary | ICD-10-CM | POA: Diagnosis not present

## 2024-05-04 DIAGNOSIS — M47816 Spondylosis without myelopathy or radiculopathy, lumbar region: Secondary | ICD-10-CM | POA: Diagnosis not present

## 2024-05-04 DIAGNOSIS — M545 Low back pain, unspecified: Secondary | ICD-10-CM | POA: Diagnosis not present

## 2024-05-04 DIAGNOSIS — M6281 Muscle weakness (generalized): Secondary | ICD-10-CM | POA: Diagnosis not present

## 2024-05-09 DIAGNOSIS — M545 Low back pain, unspecified: Secondary | ICD-10-CM | POA: Diagnosis not present

## 2024-05-09 DIAGNOSIS — M47816 Spondylosis without myelopathy or radiculopathy, lumbar region: Secondary | ICD-10-CM | POA: Diagnosis not present

## 2024-05-09 DIAGNOSIS — M6281 Muscle weakness (generalized): Secondary | ICD-10-CM | POA: Diagnosis not present

## 2024-05-11 DIAGNOSIS — M47816 Spondylosis without myelopathy or radiculopathy, lumbar region: Secondary | ICD-10-CM | POA: Diagnosis not present

## 2024-05-11 DIAGNOSIS — M545 Low back pain, unspecified: Secondary | ICD-10-CM | POA: Diagnosis not present

## 2024-05-11 DIAGNOSIS — M6281 Muscle weakness (generalized): Secondary | ICD-10-CM | POA: Diagnosis not present

## 2024-05-16 DIAGNOSIS — M47816 Spondylosis without myelopathy or radiculopathy, lumbar region: Secondary | ICD-10-CM | POA: Diagnosis not present

## 2024-05-16 DIAGNOSIS — M545 Low back pain, unspecified: Secondary | ICD-10-CM | POA: Diagnosis not present

## 2024-05-16 DIAGNOSIS — M6281 Muscle weakness (generalized): Secondary | ICD-10-CM | POA: Diagnosis not present

## 2024-05-18 DIAGNOSIS — M6281 Muscle weakness (generalized): Secondary | ICD-10-CM | POA: Diagnosis not present

## 2024-05-18 DIAGNOSIS — I5032 Chronic diastolic (congestive) heart failure: Secondary | ICD-10-CM | POA: Diagnosis not present

## 2024-05-18 DIAGNOSIS — M545 Low back pain, unspecified: Secondary | ICD-10-CM | POA: Diagnosis not present

## 2024-05-18 DIAGNOSIS — M47816 Spondylosis without myelopathy or radiculopathy, lumbar region: Secondary | ICD-10-CM | POA: Diagnosis not present

## 2024-05-18 DIAGNOSIS — I10 Essential (primary) hypertension: Secondary | ICD-10-CM | POA: Diagnosis not present

## 2024-05-23 DIAGNOSIS — M6281 Muscle weakness (generalized): Secondary | ICD-10-CM | POA: Diagnosis not present

## 2024-05-23 DIAGNOSIS — M545 Low back pain, unspecified: Secondary | ICD-10-CM | POA: Diagnosis not present

## 2024-05-23 DIAGNOSIS — M47816 Spondylosis without myelopathy or radiculopathy, lumbar region: Secondary | ICD-10-CM | POA: Diagnosis not present

## 2024-05-25 DIAGNOSIS — M47816 Spondylosis without myelopathy or radiculopathy, lumbar region: Secondary | ICD-10-CM | POA: Diagnosis not present

## 2024-05-25 DIAGNOSIS — M545 Low back pain, unspecified: Secondary | ICD-10-CM | POA: Diagnosis not present

## 2024-05-25 DIAGNOSIS — M6281 Muscle weakness (generalized): Secondary | ICD-10-CM | POA: Diagnosis not present

## 2024-05-29 DIAGNOSIS — N183 Chronic kidney disease, stage 3 unspecified: Secondary | ICD-10-CM | POA: Diagnosis not present

## 2024-05-29 DIAGNOSIS — I130 Hypertensive heart and chronic kidney disease with heart failure and stage 1 through stage 4 chronic kidney disease, or unspecified chronic kidney disease: Secondary | ICD-10-CM | POA: Diagnosis not present

## 2024-05-29 DIAGNOSIS — R7303 Prediabetes: Secondary | ICD-10-CM | POA: Diagnosis not present

## 2024-05-29 DIAGNOSIS — E782 Mixed hyperlipidemia: Secondary | ICD-10-CM | POA: Diagnosis not present

## 2024-05-29 DIAGNOSIS — I4891 Unspecified atrial fibrillation: Secondary | ICD-10-CM | POA: Diagnosis not present

## 2024-05-29 LAB — LAB REPORT - SCANNED
A1c: 6.1
EGFR: 73

## 2024-05-30 DIAGNOSIS — M6281 Muscle weakness (generalized): Secondary | ICD-10-CM | POA: Diagnosis not present

## 2024-05-30 DIAGNOSIS — M545 Low back pain, unspecified: Secondary | ICD-10-CM | POA: Diagnosis not present

## 2024-05-30 DIAGNOSIS — M47816 Spondylosis without myelopathy or radiculopathy, lumbar region: Secondary | ICD-10-CM | POA: Diagnosis not present

## 2024-06-01 DIAGNOSIS — M6281 Muscle weakness (generalized): Secondary | ICD-10-CM | POA: Diagnosis not present

## 2024-06-01 DIAGNOSIS — M47816 Spondylosis without myelopathy or radiculopathy, lumbar region: Secondary | ICD-10-CM | POA: Diagnosis not present

## 2024-06-01 DIAGNOSIS — M545 Low back pain, unspecified: Secondary | ICD-10-CM | POA: Diagnosis not present

## 2024-06-05 ENCOUNTER — Other Ambulatory Visit: Payer: Self-pay | Admitting: Cardiovascular Disease

## 2024-06-06 DIAGNOSIS — M47816 Spondylosis without myelopathy or radiculopathy, lumbar region: Secondary | ICD-10-CM | POA: Diagnosis not present

## 2024-06-06 DIAGNOSIS — M545 Low back pain, unspecified: Secondary | ICD-10-CM | POA: Diagnosis not present

## 2024-06-06 DIAGNOSIS — M6281 Muscle weakness (generalized): Secondary | ICD-10-CM | POA: Diagnosis not present

## 2024-06-15 DIAGNOSIS — M545 Low back pain, unspecified: Secondary | ICD-10-CM | POA: Diagnosis not present

## 2024-06-15 DIAGNOSIS — M47816 Spondylosis without myelopathy or radiculopathy, lumbar region: Secondary | ICD-10-CM | POA: Diagnosis not present

## 2024-06-15 DIAGNOSIS — M6281 Muscle weakness (generalized): Secondary | ICD-10-CM | POA: Diagnosis not present

## 2024-06-18 DIAGNOSIS — I5032 Chronic diastolic (congestive) heart failure: Secondary | ICD-10-CM | POA: Diagnosis not present

## 2024-06-18 DIAGNOSIS — I10 Essential (primary) hypertension: Secondary | ICD-10-CM | POA: Diagnosis not present

## 2024-06-20 DIAGNOSIS — M6281 Muscle weakness (generalized): Secondary | ICD-10-CM | POA: Diagnosis not present

## 2024-06-20 DIAGNOSIS — M545 Low back pain, unspecified: Secondary | ICD-10-CM | POA: Diagnosis not present

## 2024-06-20 DIAGNOSIS — M47816 Spondylosis without myelopathy or radiculopathy, lumbar region: Secondary | ICD-10-CM | POA: Diagnosis not present

## 2024-06-22 DIAGNOSIS — M47816 Spondylosis without myelopathy or radiculopathy, lumbar region: Secondary | ICD-10-CM | POA: Diagnosis not present

## 2024-06-22 DIAGNOSIS — M545 Low back pain, unspecified: Secondary | ICD-10-CM | POA: Diagnosis not present

## 2024-06-22 DIAGNOSIS — M6281 Muscle weakness (generalized): Secondary | ICD-10-CM | POA: Diagnosis not present

## 2024-06-26 DIAGNOSIS — L814 Other melanin hyperpigmentation: Secondary | ICD-10-CM | POA: Diagnosis not present

## 2024-06-26 DIAGNOSIS — L821 Other seborrheic keratosis: Secondary | ICD-10-CM | POA: Diagnosis not present

## 2024-06-26 DIAGNOSIS — L570 Actinic keratosis: Secondary | ICD-10-CM | POA: Diagnosis not present

## 2024-06-27 DIAGNOSIS — M545 Low back pain, unspecified: Secondary | ICD-10-CM | POA: Diagnosis not present

## 2024-06-27 DIAGNOSIS — M47816 Spondylosis without myelopathy or radiculopathy, lumbar region: Secondary | ICD-10-CM | POA: Diagnosis not present

## 2024-06-27 DIAGNOSIS — M6281 Muscle weakness (generalized): Secondary | ICD-10-CM | POA: Diagnosis not present

## 2024-06-29 DIAGNOSIS — M6281 Muscle weakness (generalized): Secondary | ICD-10-CM | POA: Diagnosis not present

## 2024-06-29 DIAGNOSIS — M47816 Spondylosis without myelopathy or radiculopathy, lumbar region: Secondary | ICD-10-CM | POA: Diagnosis not present

## 2024-06-29 DIAGNOSIS — M545 Low back pain, unspecified: Secondary | ICD-10-CM | POA: Diagnosis not present

## 2024-07-04 DIAGNOSIS — M6281 Muscle weakness (generalized): Secondary | ICD-10-CM | POA: Diagnosis not present

## 2024-07-04 DIAGNOSIS — M545 Low back pain, unspecified: Secondary | ICD-10-CM | POA: Diagnosis not present

## 2024-07-04 DIAGNOSIS — M47816 Spondylosis without myelopathy or radiculopathy, lumbar region: Secondary | ICD-10-CM | POA: Diagnosis not present

## 2024-07-18 DIAGNOSIS — I5032 Chronic diastolic (congestive) heart failure: Secondary | ICD-10-CM | POA: Diagnosis not present

## 2024-07-18 DIAGNOSIS — M47816 Spondylosis without myelopathy or radiculopathy, lumbar region: Secondary | ICD-10-CM | POA: Diagnosis not present

## 2024-07-18 DIAGNOSIS — I10 Essential (primary) hypertension: Secondary | ICD-10-CM | POA: Diagnosis not present

## 2024-07-18 DIAGNOSIS — M6281 Muscle weakness (generalized): Secondary | ICD-10-CM | POA: Diagnosis not present

## 2024-07-18 DIAGNOSIS — M545 Low back pain, unspecified: Secondary | ICD-10-CM | POA: Diagnosis not present

## 2024-07-31 DIAGNOSIS — L821 Other seborrheic keratosis: Secondary | ICD-10-CM | POA: Diagnosis not present

## 2024-07-31 DIAGNOSIS — L578 Other skin changes due to chronic exposure to nonionizing radiation: Secondary | ICD-10-CM | POA: Diagnosis not present

## 2024-07-31 DIAGNOSIS — L853 Xerosis cutis: Secondary | ICD-10-CM | POA: Diagnosis not present

## 2024-07-31 DIAGNOSIS — L814 Other melanin hyperpigmentation: Secondary | ICD-10-CM | POA: Diagnosis not present

## 2024-07-31 DIAGNOSIS — L570 Actinic keratosis: Secondary | ICD-10-CM | POA: Diagnosis not present

## 2024-07-31 DIAGNOSIS — L905 Scar conditions and fibrosis of skin: Secondary | ICD-10-CM | POA: Diagnosis not present
